# Patient Record
Sex: Female | Born: 1985 | Race: Black or African American | Hispanic: No | Marital: Single | Smoking: Never smoker
Health system: Southern US, Community
[De-identification: ages and names within clinical notes are randomized; demographics above are authoritative.]

---

## 2007-10-18 ENCOUNTER — Emergency Department: Payer: Self-pay | Admitting: Emergency Medicine

## 2008-04-30 ENCOUNTER — Inpatient Hospital Stay: Payer: Self-pay

## 2009-08-22 ENCOUNTER — Emergency Department: Payer: Self-pay | Admitting: Emergency Medicine

## 2011-05-29 IMAGING — CT CT HEAD WITHOUT CONTRAST
2 series · 16 of 30 positions shown, 20 images · non-contrast
Comparison: none

REASON FOR EXAM: 24 hr hx of severe headache, not prone to migraines
COMMENTS:

[Series 2: without · axial · non-contrast · 0.38mm/px · z∈[+450,+570]mm · 13 of 29 slices shown, 17 images]
[im 3/29  brain]
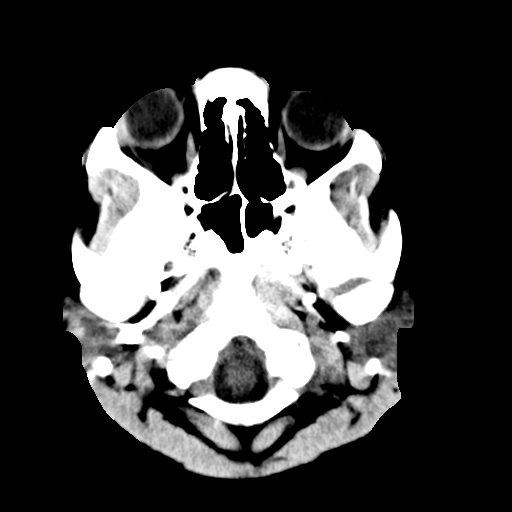
[im 3/29  bone]
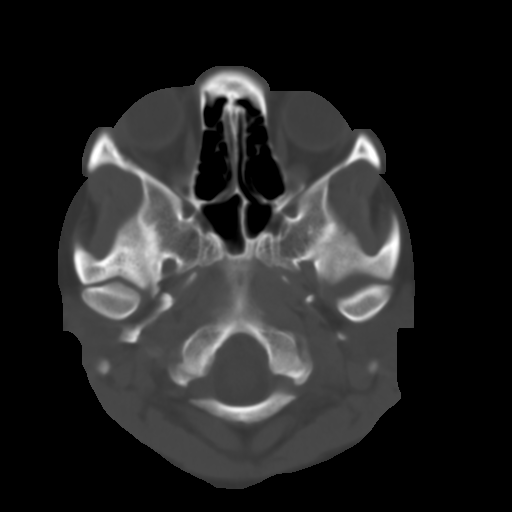
[im 5/29  brain]
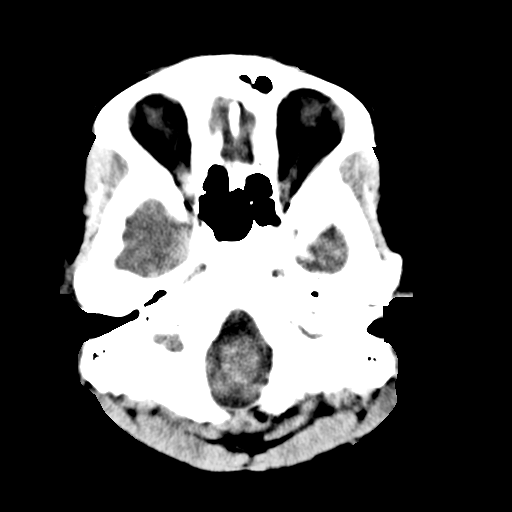
[im 7/29  brain]
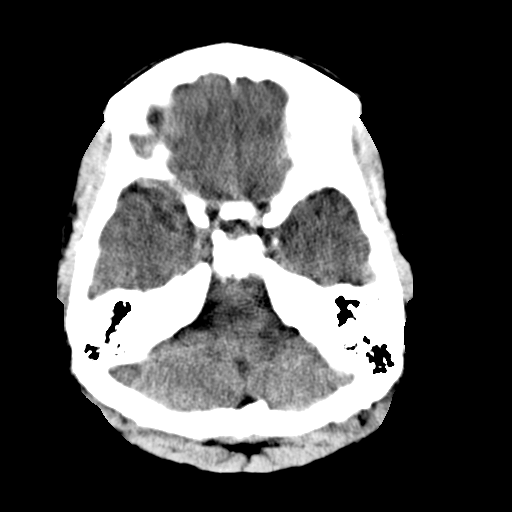
[im 9/29  brain]
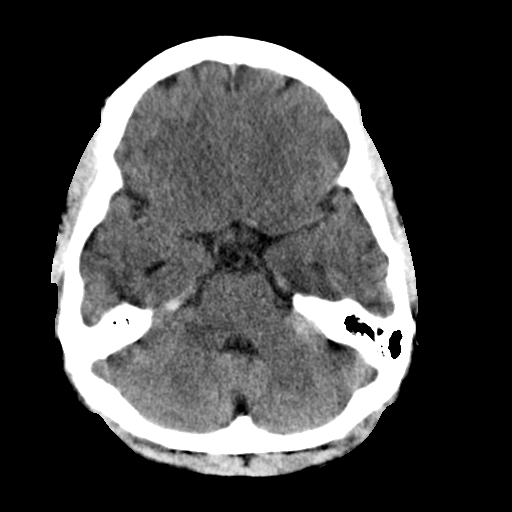
[im 11/29  brain]
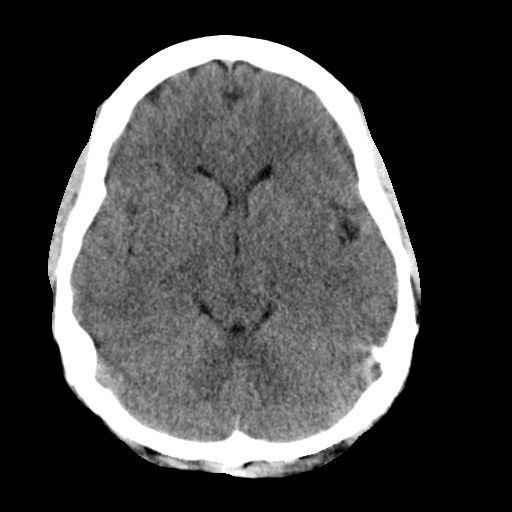
[im 11/29  bone]
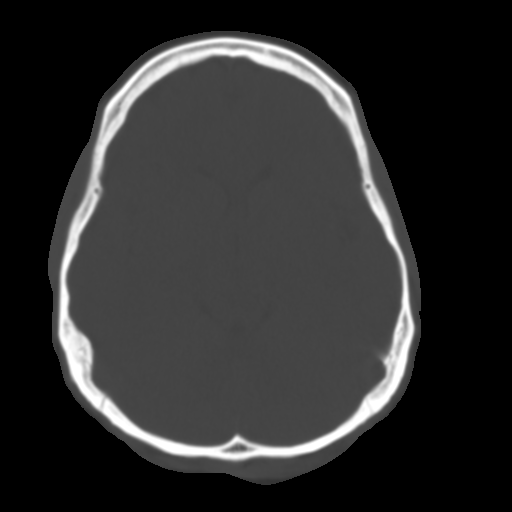
[im 13/29  brain]
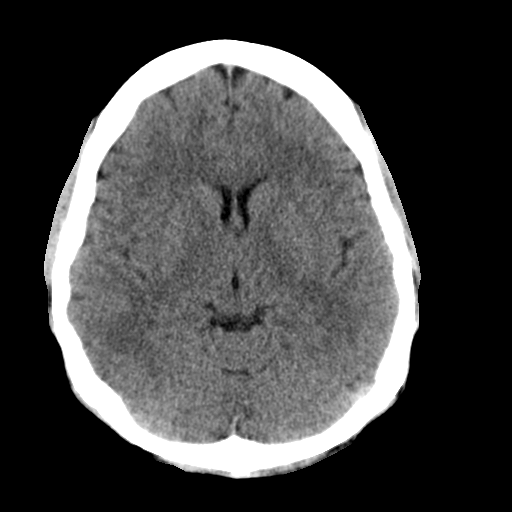
[im 15/29  brain]
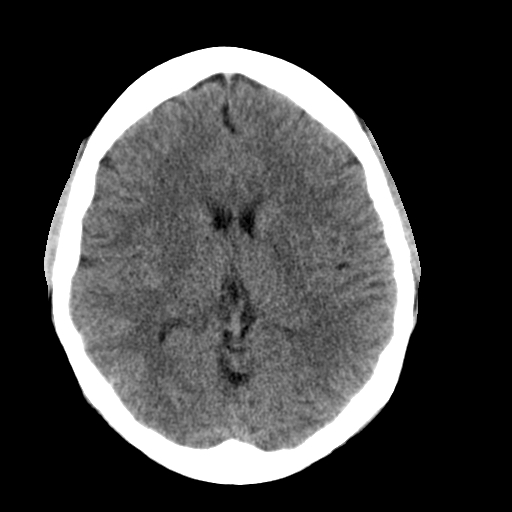
[im 17/29  brain]
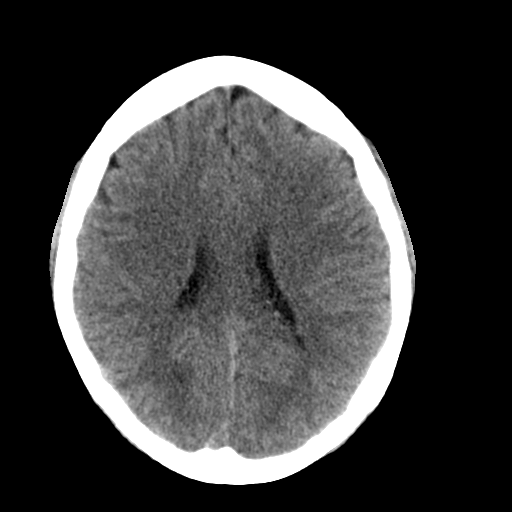
[im 19/29  brain]
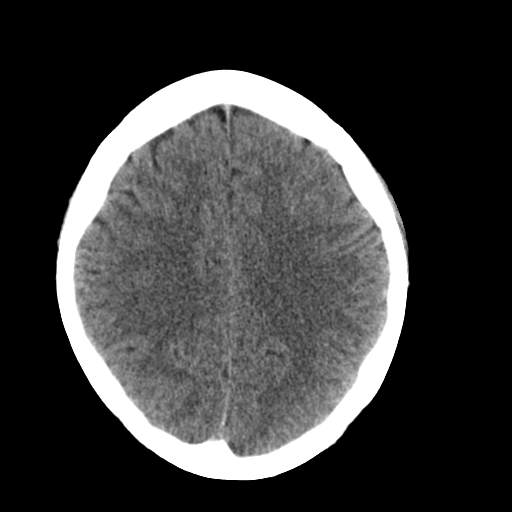
[im 19/29  bone]
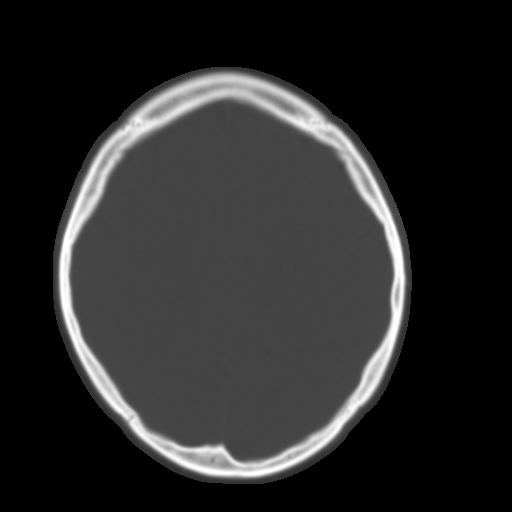
[im 21/29  brain]
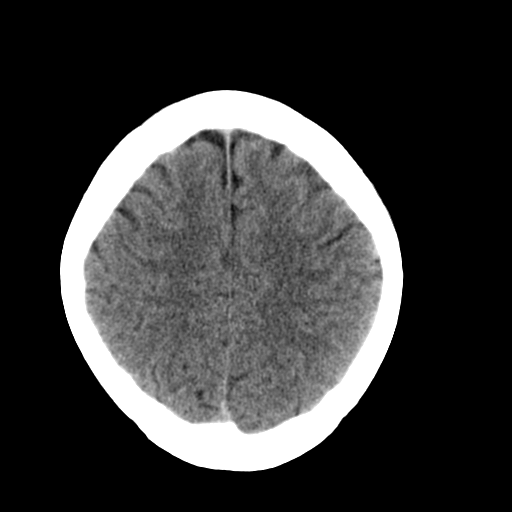
[im 23/29  brain]
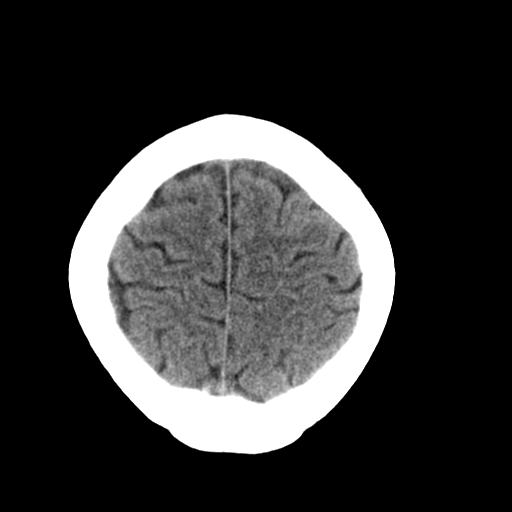
[im 25/29  brain]
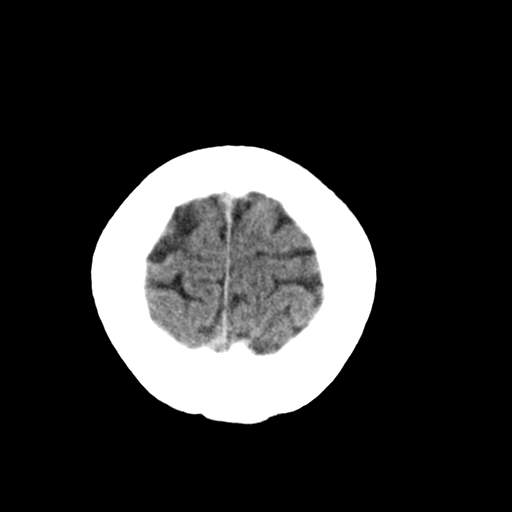
[im 27/29  brain]
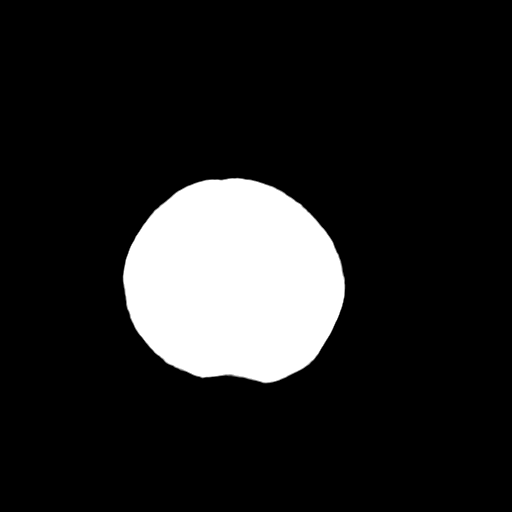
[im 27/29  bone]
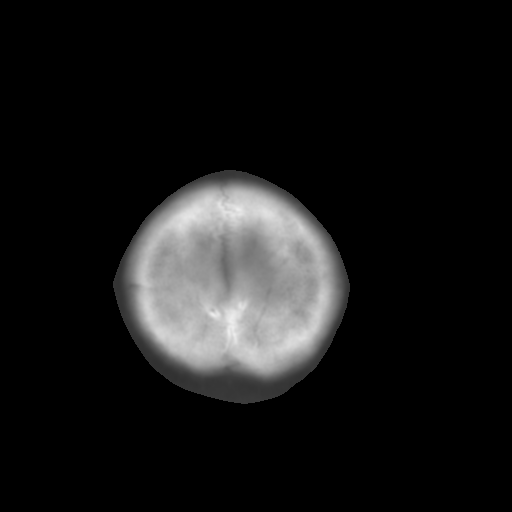

[Series 3: bone · axial · 0.38mm/px · z∈[+450,+490]mm · 3 of 29 slices shown]
[im 3/29  bone]
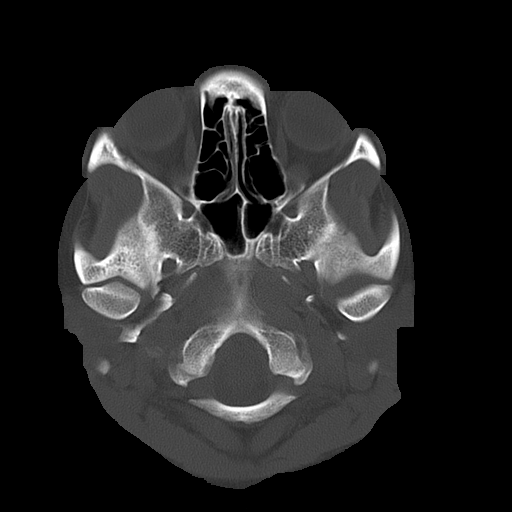
[im 7/29  bone]
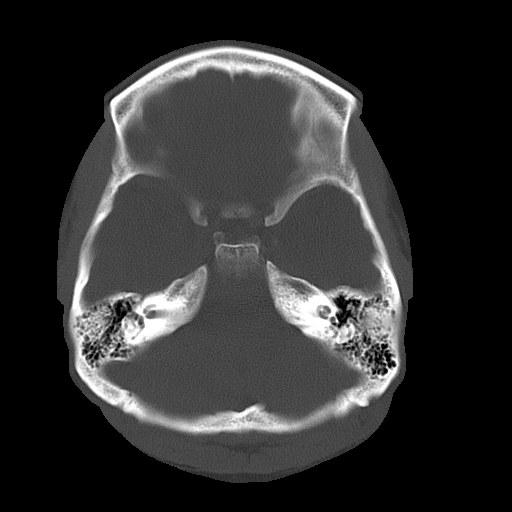
[im 11/29  bone]
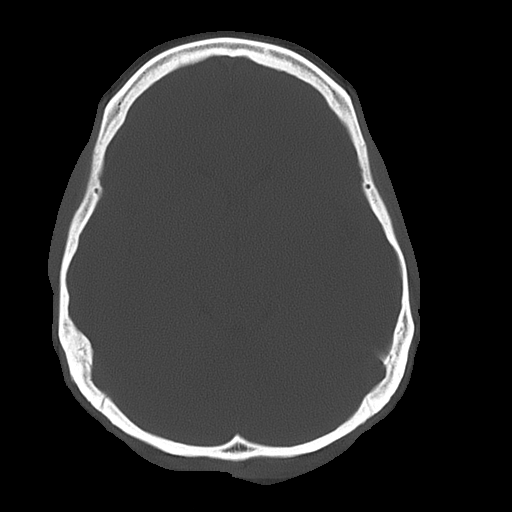

[16 of 30 positions shown; findings below may reference images not displayed]

PROCEDURE:     CT  - CT HEAD WITHOUT CONTRAST  - August 23, 2009 [DATE]

RESULT:     Noncontrast emergent CT of the brain is performed in the
standard fashion. There is no previous exam for comparison.

The ventricles and sulci are normal. There is no hemorrhage. There is no
focal mass, mass-effect or midline shift. There is no evidence of edema or
territorial infarct. The bone windows demonstrate normal aeration of the
paranasal sinuses and mastoid air cells. There is no skull fracture
demonstrated.
IMPRESSION: 1. No acute intracranial abnormality.

## 2011-06-03 ENCOUNTER — Ambulatory Visit: Payer: Self-pay | Admitting: Otolaryngology

## 2013-10-16 ENCOUNTER — Emergency Department: Payer: Self-pay | Admitting: Emergency Medicine

## 2013-10-21 ENCOUNTER — Inpatient Hospital Stay: Payer: Self-pay

## 2013-10-21 LAB — CBC WITH DIFFERENTIAL/PLATELET
Basophil #: 0 10*3/uL (ref 0.0–0.1)
Basophil %: 0.1 %
EOS ABS: 0.1 10*3/uL (ref 0.0–0.7)
Eosinophil %: 1.5 %
HCT: 35.9 % (ref 35.0–47.0)
HGB: 12.3 g/dL (ref 12.0–16.0)
LYMPHS PCT: 22.7 %
Lymphocyte #: 1.8 10*3/uL (ref 1.0–3.6)
MCH: 30.8 pg (ref 26.0–34.0)
MCHC: 34.2 g/dL (ref 32.0–36.0)
MCV: 90 fL (ref 80–100)
MONOS PCT: 7.1 %
Monocyte #: 0.6 x10 3/mm (ref 0.2–0.9)
NEUTROS ABS: 5.5 10*3/uL (ref 1.4–6.5)
NEUTROS PCT: 68.6 %
Platelet: 176 10*3/uL (ref 150–440)
RBC: 3.99 10*6/uL (ref 3.80–5.20)
RDW: 14.6 % — ABNORMAL HIGH (ref 11.5–14.5)
WBC: 8 10*3/uL (ref 3.6–11.0)

## 2013-10-22 LAB — PROTEIN / CREATININE RATIO, URINE
Creatinine, Urine: 117.7 mg/dL (ref 30.0–125.0)
PROTEIN, RANDOM URINE: 26 mg/dL — AB (ref 0–12)
Protein/Creat. Ratio: 221 mg/gCREAT — ABNORMAL HIGH (ref 0–200)

## 2013-10-22 LAB — BASIC METABOLIC PANEL
Anion Gap: 9 (ref 7–16)
BUN: 8 mg/dL (ref 7–18)
CO2: 21 mmol/L (ref 21–32)
CREATININE: 0.61 mg/dL (ref 0.60–1.30)
Calcium, Total: 8.9 mg/dL (ref 8.5–10.1)
Chloride: 106 mmol/L (ref 98–107)
EGFR (African American): >60
EGFR (Non-African Amer.): >60
GLUCOSE: 67 mg/dL (ref 65–99)
OSMOLALITY: 269 (ref 275–301)
POTASSIUM: 3.9 mmol/L (ref 3.5–5.1)
Sodium: 136 mmol/L (ref 136–145)

## 2013-10-22 LAB — URIC ACID: Uric Acid: 5.2 mg/dL (ref 2.6–6.0)

## 2013-10-22 LAB — SGOT (AST)(ARMC): AST: 28 U/L (ref 15–37)

## 2013-10-23 LAB — HEMATOCRIT: HCT: 34.9 % — ABNORMAL LOW (ref 35.0–47.0)

## 2013-10-24 LAB — PATHOLOGY REPORT

## 2014-08-06 NOTE — H&P (Signed)
L&D Evaluation:  History:  HPI Pt is a 29 yo G2P1001 at 36.[redacted] weeks GA with an EDC of 11/17/13 presents to L&D after PPROM at home. She reports +FM, and ctx. She denies vb. Her prenatal course is significant for fetal renal pyelectasis which was resolved by 28 weeks. She is A+, RI, VI, and GBS negative. She recieved her Tdap at 30 weeks.   Presents with contractions, leaking fluid   Patient's Medical History other  allergic rhinitis   Patient's Surgical History none   Medications Pre Natal Vitamins  zyrtec, flonase   Allergies NKDA   Social History none   Family History Non-Contributory   ROS:  ROS All systems were reviewed.  HEENT, CNS, GI, GU, Respiratory, CV, Renal and Musculoskeletal systems were found to be normal.   Exam:  Vital Signs stable   General no apparent distress   Mental Status clear   Chest clear   Heart normal sinus rhythm   Abdomen gravid, tender with contractions   Pelvic no external lesions, 3cm upon arrival   Mebranes Ruptured   Description clear   FHT normal rate with no decels   Ucx regular   Skin dry, no lesions, no rashes   Lymph no lymphadenopathy   Impression:  Impression PPROM, reactive NST, IUP at 36.1 weeks, Labor   Plan:  Plan monitor contractions and for cervical change, anticipate svd   Follow Up Appointment need to schedule   Electronic Signatures: Jannet MantisSubudhi, Yomara Toothman (CNM)  (Signed 27-Jul-15 06:29)  Authored: L&D Evaluation   Last Updated: 27-Jul-15 06:29 by Jannet MantisSubudhi, Ruthetta Koopmann (CNM)

## 2015-03-30 NOTE — L&D Delivery Note (Addendum)
Deliver Note   Date of Delivery:   11/13/2015 Primary OB:   [facility] Gestational Age/EDD: 3019w3d by 11/17/2015, by Last Menstrual Period  Antepartum complications:  OB History    Gravida Para Term Preterm AB Living   3 2 1 1   2    SAB TAB Ectopic Multiple Live Births                  Delivered By:   Vena AustriaStaebler, Lerline Valdivia MD  Delivery Type:    Anesthesia:       Intrapartum complications:  GBS:    Negative (07/27 0000) Laceration:     none Episiotomy:    none Placenta:    Spontaneous Estimated Blood Loss:    Baby:    Liveborn female , APGAR (1 MIN):  8 APGAR (5 MINS):  9 weight   7lsb 8oz   Deliver Details   A liveborn female was delivered via TSVD (Presentation:ROA ).  APGAR: 8, 9 ; weight  7lbs 8oz.   Placenta status: spontaneous, intact.  Cord: 3VC without complications: Marland Kitchen.    Mom to postpartum.  Baby to Couplet care / Skin to Skin.

## 2015-04-30 LAB — OB RESULTS CONSOLE RUBELLA ANTIBODY, IGM: RUBELLA: IMMUNE

## 2015-04-30 LAB — OB RESULTS CONSOLE VARICELLA ZOSTER ANTIBODY, IGG: Varicella: IMMUNE

## 2015-04-30 LAB — OB RESULTS CONSOLE HIV ANTIBODY (ROUTINE TESTING): HIV: NONREACTIVE

## 2015-04-30 LAB — OB RESULTS CONSOLE HEPATITIS B SURFACE ANTIGEN: Hepatitis B Surface Ag: NEGATIVE

## 2015-04-30 LAB — OB RESULTS CONSOLE RPR: RPR: NONREACTIVE

## 2015-10-23 LAB — OB RESULTS CONSOLE GBS: STREP GROUP B AG: NEGATIVE

## 2015-11-13 ENCOUNTER — Inpatient Hospital Stay
Admission: EM | Admit: 2015-11-13 | Discharge: 2015-11-15 | DRG: 775 | Disposition: A | Discharge status: Home / Self Care | Payer: BLUE CROSS/BLUE SHIELD | Attending: Obstetrics and Gynecology | Admitting: Obstetrics and Gynecology

## 2015-11-13 ENCOUNTER — Encounter: Payer: Self-pay | Admitting: *Deleted

## 2015-11-13 ENCOUNTER — Inpatient Hospital Stay: Payer: BLUE CROSS/BLUE SHIELD | Admitting: Anesthesiology

## 2015-11-13 DIAGNOSIS — O4202 Full-term premature rupture of membranes, onset of labor within 24 hours of rupture: Secondary | ICD-10-CM | POA: Diagnosis present

## 2015-11-13 DIAGNOSIS — Z3A39 39 weeks gestation of pregnancy: Secondary | ICD-10-CM | POA: Diagnosis not present

## 2015-11-13 LAB — CBC
HEMATOCRIT: 35.8 % (ref 35.0–47.0)
Hemoglobin: 12.6 g/dL (ref 12.0–16.0)
MCH: 31.1 pg (ref 26.0–34.0)
MCHC: 35.1 g/dL (ref 32.0–36.0)
MCV: 88.7 fL (ref 80.0–100.0)
Platelets: 131 10*3/uL — ABNORMAL LOW (ref 150–440)
RBC: 4.04 MIL/uL (ref 3.80–5.20)
RDW: 13.9 % (ref 11.5–14.5)
WBC: 6.9 10*3/uL (ref 3.6–11.0)

## 2015-11-13 LAB — TYPE AND SCREEN
ABO/RH(D): A POS
ANTIBODY SCREEN: NEGATIVE

## 2015-11-13 MED ORDER — LIDOCAINE HCL (PF) 1 % IJ SOLN
INTRAMUSCULAR | Status: DC | PRN
  Administered 2015-11-13: 3 mL via SUBCUTANEOUS

## 2015-11-13 MED ORDER — SODIUM CHLORIDE FLUSH 0.9 % IV SOLN
INTRAVENOUS | Status: AC
  Filled 2015-11-13: qty 10

## 2015-11-13 MED ORDER — ONDANSETRON HCL 4 MG/2ML IJ SOLN: 4.0000 mg | Freq: Three times a day (TID) | INTRAMUSCULAR | Status: DC | PRN

## 2015-11-13 MED ORDER — NALBUPHINE HCL 10 MG/ML IJ SOLN: 5.0000 mg | INTRAMUSCULAR | Status: DC | PRN

## 2015-11-13 MED ORDER — LIDOCAINE-EPINEPHRINE (PF) 1.5 %-1:200000 IJ SOLN
INTRAMUSCULAR | Status: DC | PRN
  Administered 2015-11-13: 3 mL via EPIDURAL

## 2015-11-13 MED ORDER — SOD CITRATE-CITRIC ACID 500-334 MG/5ML PO SOLN: 30.0000 mL | ORAL | Status: DC | PRN

## 2015-11-13 MED ORDER — ACETAMINOPHEN 325 MG PO TABS: 650.0000 mg | ORAL_TABLET | ORAL | Status: DC | PRN

## 2015-11-13 MED ORDER — MISOPROSTOL 200 MCG PO TABS
ORAL_TABLET | ORAL | Status: AC
  Filled 2015-11-13: qty 4

## 2015-11-13 MED ORDER — OXYCODONE-ACETAMINOPHEN 5-325 MG PO TABS: 1.0000 | ORAL_TABLET | ORAL | Status: DC | PRN

## 2015-11-13 MED ORDER — LIDOCAINE HCL (PF) 1 % IJ SOLN
INTRAMUSCULAR | Status: AC
  Filled 2015-11-13: qty 30

## 2015-11-13 MED ORDER — NALBUPHINE HCL 10 MG/ML IJ SOLN: 5.0000 mg | Freq: Once | INTRAMUSCULAR | Status: DC | PRN

## 2015-11-13 MED ORDER — SODIUM CHLORIDE 0.9% FLUSH: 3.0000 mL | INTRAVENOUS | Status: DC | PRN

## 2015-11-13 MED ORDER — MEPERIDINE HCL 25 MG/ML IJ SOLN: 6.2500 mg | INTRAMUSCULAR | Status: DC | PRN

## 2015-11-13 MED ORDER — DEXTROSE 5 % IV SOLN
1.0000 ug/kg/h | INTRAVENOUS | Status: DC | PRN
  Filled 2015-11-13: qty 2

## 2015-11-13 MED ORDER — OXYTOCIN 40 UNITS IN LACTATED RINGERS INFUSION - SIMPLE MED
1.0000 m[IU]/min | INTRAVENOUS | Status: DC
  Administered 2015-11-13: 2 m[IU]/min via INTRAVENOUS

## 2015-11-13 MED ORDER — FENTANYL 2.5 MCG/ML W/ROPIVACAINE 0.2% IN NS 100 ML EPIDURAL INFUSION (ARMC-ANES)
EPIDURAL | Status: DC | PRN
  Administered 2015-11-13: 9 mL/h via EPIDURAL

## 2015-11-13 MED ORDER — DIPHENHYDRAMINE HCL 50 MG/ML IJ SOLN: 12.5000 mg | INTRAMUSCULAR | Status: DC | PRN

## 2015-11-13 MED ORDER — BUPIVACAINE HCL (PF) 0.25 % IJ SOLN
INTRAMUSCULAR | Status: DC | PRN
Start: 2015-11-13 — End: 2015-11-13
  Administered 2015-11-13: 3 mL via EPIDURAL
  Administered 2015-11-13: 5 mL via EPIDURAL

## 2015-11-13 MED ORDER — DIBUCAINE 1 % RE OINT: 1.0000 "application " | TOPICAL_OINTMENT | RECTAL | Status: DC | PRN

## 2015-11-13 MED ORDER — LACTATED RINGERS IV SOLN: 500.0000 mL | INTRAVENOUS | Status: DC | PRN

## 2015-11-13 MED ORDER — COCONUT OIL OIL: 1.0000 "application " | TOPICAL_OIL | Status: DC | PRN

## 2015-11-13 MED ORDER — BENZOCAINE-MENTHOL 20-0.5 % EX AERO: 1.0000 "application " | INHALATION_SPRAY | CUTANEOUS | Status: DC | PRN

## 2015-11-13 MED ORDER — DIPHENHYDRAMINE HCL 25 MG PO CAPS: 25.0000 mg | ORAL_CAPSULE | ORAL | Status: DC | PRN

## 2015-11-13 MED ORDER — ONDANSETRON HCL 4 MG/2ML IJ SOLN
4.0000 mg | Freq: Once | INTRAMUSCULAR | Status: DC
  Filled 2015-11-13: qty 2

## 2015-11-13 MED ORDER — OXYTOCIN 40 UNITS IN LACTATED RINGERS INFUSION - SIMPLE MED
2.5000 [IU]/h | INTRAVENOUS | Status: DC
  Filled 2015-11-13 (×2): qty 1000

## 2015-11-13 MED ORDER — ONDANSETRON HCL 4 MG PO TABS: 4.0000 mg | ORAL_TABLET | ORAL | Status: DC | PRN

## 2015-11-13 MED ORDER — SCOPOLAMINE 1 MG/3DAYS TD PT72: 1.0000 | MEDICATED_PATCH | Freq: Once | TRANSDERMAL | Status: DC

## 2015-11-13 MED ORDER — FENTANYL 2.5 MCG/ML W/ROPIVACAINE 0.2% IN NS 100 ML EPIDURAL INFUSION (ARMC-ANES)
EPIDURAL | Status: AC
  Filled 2015-11-13: qty 100

## 2015-11-13 MED ORDER — LACTATED RINGERS IV SOLN: INTRAVENOUS | Status: DC

## 2015-11-13 MED ORDER — BUTORPHANOL TARTRATE 1 MG/ML IJ SOLN
1.0000 mg | INTRAMUSCULAR | Status: DC | PRN
  Administered 2015-11-13: 1 mg via INTRAVENOUS
  Filled 2015-11-13: qty 1

## 2015-11-13 MED ORDER — LACTATED RINGERS IV SOLN
INTRAVENOUS | Status: DC
  Administered 2015-11-13: 1000 mL via INTRAVENOUS

## 2015-11-13 MED ORDER — DIPHENHYDRAMINE HCL 25 MG PO CAPS: 25.0000 mg | ORAL_CAPSULE | Freq: Four times a day (QID) | ORAL | Status: DC | PRN

## 2015-11-13 MED ORDER — ONDANSETRON HCL 4 MG/2ML IJ SOLN
4.0000 mg | Freq: Four times a day (QID) | INTRAMUSCULAR | Status: DC | PRN
  Administered 2015-11-13: 4 mg via INTRAVENOUS
  Filled 2015-11-13: qty 2

## 2015-11-13 MED ORDER — NALOXONE HCL 0.4 MG/ML IJ SOLN: 0.4000 mg | INTRAMUSCULAR | Status: DC | PRN

## 2015-11-13 MED ORDER — WITCH HAZEL-GLYCERIN EX PADS: 1.0000 "application " | MEDICATED_PAD | CUTANEOUS | Status: DC | PRN

## 2015-11-13 MED ORDER — SENNOSIDES-DOCUSATE SODIUM 8.6-50 MG PO TABS
2.0000 | ORAL_TABLET | ORAL | Status: DC
  Administered 2015-11-14 – 2015-11-15 (×2): 2 via ORAL
  Filled 2015-11-13 (×2): qty 2

## 2015-11-13 MED ORDER — FENTANYL 2.5 MCG/ML W/ROPIVACAINE 0.2% IN NS 100 ML EPIDURAL INFUSION (ARMC-ANES): 10.0000 mL/h | EPIDURAL | Status: DC

## 2015-11-13 MED ORDER — OXYTOCIN BOLUS FROM INFUSION: 500.0000 mL | Freq: Once | INTRAVENOUS | Status: DC

## 2015-11-13 MED ORDER — OXYTOCIN 10 UNIT/ML IJ SOLN
INTRAMUSCULAR | Status: AC
  Filled 2015-11-13: qty 2

## 2015-11-13 MED ORDER — PRENATAL MULTIVITAMIN CH
1.0000 | ORAL_TABLET | Freq: Every day | ORAL | Status: DC
  Administered 2015-11-14: 1 via ORAL
  Filled 2015-11-13: qty 1

## 2015-11-13 MED ORDER — IBUPROFEN 600 MG PO TABS
600.0000 mg | ORAL_TABLET | Freq: Four times a day (QID) | ORAL | Status: DC
  Administered 2015-11-14 – 2015-11-15 (×6): 600 mg via ORAL
  Filled 2015-11-13 (×6): qty 1

## 2015-11-13 MED ORDER — OXYCODONE-ACETAMINOPHEN 5-325 MG PO TABS: 2.0000 | ORAL_TABLET | ORAL | Status: DC | PRN

## 2015-11-13 MED ORDER — TERBUTALINE SULFATE 1 MG/ML IJ SOLN: 0.2500 mg | Freq: Once | INTRAMUSCULAR | Status: DC | PRN

## 2015-11-13 MED ORDER — ACETAMINOPHEN 325 MG PO TABS: 650.0000 mg | ORAL_TABLET | Freq: Four times a day (QID) | ORAL | Status: DC

## 2015-11-13 MED ORDER — KETOROLAC TROMETHAMINE 30 MG/ML IJ SOLN: 30.0000 mg | Freq: Four times a day (QID) | INTRAMUSCULAR | Status: DC | PRN

## 2015-11-13 MED ORDER — KETOROLAC TROMETHAMINE 30 MG/ML IJ SOLN
30.0000 mg | Freq: Four times a day (QID) | INTRAMUSCULAR | Status: DC | PRN
Start: 2015-11-13 — End: 2015-11-13

## 2015-11-13 MED ORDER — LIDOCAINE HCL (PF) 1 % IJ SOLN: 30.0000 mL | INTRAMUSCULAR | Status: DC | PRN

## 2015-11-13 MED ORDER — SIMETHICONE 80 MG PO CHEW: 80.0000 mg | CHEWABLE_TABLET | ORAL | Status: DC | PRN

## 2015-11-13 MED ORDER — AMMONIA AROMATIC IN INHA
RESPIRATORY_TRACT | Status: AC
  Filled 2015-11-13: qty 10

## 2015-11-13 MED ORDER — ONDANSETRON HCL 4 MG/2ML IJ SOLN: 4.0000 mg | INTRAMUSCULAR | Status: DC | PRN

## 2015-11-13 NOTE — Anesthesia Preprocedure Evaluation (Signed)
Anesthesia Evaluation  Patient identified by MRN, date of birth, ID band Patient awake    Reviewed: Allergy & Precautions, NPO status , Patient's Chart, lab work & pertinent test results  History of Anesthesia Complications Negative for: history of anesthetic complications  Airway Mallampati: II  TM Distance: >3 FB Neck ROM: Full    Dental no notable dental hx.    Pulmonary neg pulmonary ROS,    breath sounds clear to auscultation- rhonchi (-) wheezing      Cardiovascular Exercise Tolerance: Good (-) hypertension(-) CAD and (-) Past MI  Rhythm:Regular Rate:Normal - Systolic murmurs and - Diastolic murmurs    Neuro/Psych negative neurological ROS  negative psych ROS   GI/Hepatic negative GI ROS, Neg liver ROS,   Endo/Other  negative endocrine ROSneg diabetes  Renal/GU negative Renal ROS     Musculoskeletal   Abdominal (+) + obese, Gravid abdomen   Peds  Hematology negative hematology ROS (+)   Anesthesia Other Findings   Reproductive/Obstetrics (+) Pregnancy                             Anesthesia Physical Anesthesia Plan  ASA: II  Anesthesia Plan: Epidural   Post-op Pain Management:    Induction:   Airway Management Planned:   Additional Equipment:   Intra-op Plan:   Post-operative Plan:   Informed Consent: I have reviewed the patients History and Physical, chart, labs and discussed the procedure including the risks, benefits and alternatives for the proposed anesthesia with the patient or authorized representative who has indicated his/her understanding and acceptance.     Plan Discussed with: CRNA and Anesthesiologist  Anesthesia Plan Comments: (Plan for epidural for labor. Discussed epidural vs spinal vs GA if need for C-section)        Anesthesia Quick Evaluation

## 2015-11-13 NOTE — OB Triage Note (Signed)
Here with complaint of SROM at 0700.

## 2015-11-13 NOTE — Progress Notes (Signed)
Subjective:  Comfortable with epidural in place  Objective:   Vitals: Blood pressure 127/62, pulse 87, temperature 98 F (36.7 C), temperature source Oral, resp. rate 17, height 5\' 7"  (1.702 m), weight 93.9 kg (207 lb), last menstrual period 02/10/2015, SpO2 99 %. General: NAD Abdomen: gravid, non-tender Cervical Exam:  Dilation: 5.5 Effacement (%): 80 Cervical Position: Posterior Station: -1 Presentation: Vertex Exam by:: Falen Lehrmann  FHT: 130, moderate, +accels, no decels Toco: spaced out since epidural  Results for orders placed or performed during the hospital encounter of 11/13/15 (from the past 24 hour(s))  CBC     Status: Abnormal   Collection Time: 11/13/15  9:08 AM  Result Value Ref Range   WBC 6.9 3.6 - 11.0 K/uL   RBC 4.04 3.80 - 5.20 MIL/uL   Hemoglobin 12.6 12.0 - 16.0 g/dL   HCT 16.135.8 09.635.0 - 04.547.0 %   MCV 88.7 80.0 - 100.0 fL   MCH 31.1 26.0 - 34.0 pg   MCHC 35.1 32.0 - 36.0 g/dL   RDW 40.913.9 81.111.5 - 91.414.5 %   Platelets 131 (L) 150 - 440 K/uL  Type and screen [facility]     Status: None   Collection Time: 11/13/15  9:08 AM  Result Value Ref Range   ABO/RH(D) A POS    Antibody Screen NEG    Sample Expiration 11/16/2015     Assessment:   29 y.o. [id number] 3660w3d PROM  Plan:   1) Labor - restart pitocin  2) Fetus -  Cat I tracing

## 2015-11-13 NOTE — Discharge Summary (Signed)
Obstetric Discharge Summary Reason for Admission: onset of labor Prenatal Procedures: NST Intrapartum Procedures: spontaneous vaginal delivery Postpartum Procedures: none Complications-Operative and Postpartum: none Hemoglobin  Date Value Ref Range Status  11/13/2015 12.6 12.0 - 16.0 g/dL Final   HGB  Date Value Ref Range Status  10/21/2013 12.3 12.0 - 16.0 g/dL Final   HCT  Date Value Ref Range Status  11/13/2015 35.8 35.0 - 47.0 % Final  10/23/2013 34.9 (L) 35.0 - 47.0 % Final    Physical Exam:  General: alert, appears stated age and no distress Lochia: appropriate Uterine Fundus: firm Incision: healing well DVT Evaluation: No evidence of DVT seen on physical exam.  Discharge Diagnoses: Term Pregnancy-delivered  Discharge Information: Date: 11/13/2015 Activity: pelvic rest Diet: routine   Medication List    STOP taking these medications   ferrous sulfate 325 (65 FE) MG EC tablet     TAKE these medications   ibuprofen 600 MG tablet Commonly known as:  ADVIL,MOTRIN Take 1 tablet (600 mg total) by mouth every 6 (six) hours as needed for mild pain or moderate pain.   prenatal multivitamin Tabs tablet Take 1 tablet by mouth daily at 12 noon.       Condition: stable Discharge to: home Follow-up Information    Lorrene ReidSTAEBLER, Alexxia Stankiewicz M, MD Follow up in 6 week(s).   Specialty:  Obstetrics and Gynecology Why:  postpartum visit Contact information: [address] [place] [address] [phone]           Newborn Data: Live born unspecified sex  Birth Weight:   APGAR: ,   Home with mother.  Gail Lowe 11/13/2015, 5:56 PM

## 2015-11-13 NOTE — H&P (Signed)
Obstetric H&P   Chief Complaint: Leaking fluid  Prenatal Care Provider: [facility]  History of Present Illness: 30 y.o. [id number] presenting at 39 weeks 2 days [facility]/2015 presenting with loss of fluid this AM.  +FM, no VB, irregular contractions.   Last check in clinic 3cm dilated yesterday  A pos / ABSC neg / RI / VZI / HBsAg neg / HIV neg / RPR NR / 1-hr 97 / GBS negative   Review of Systems: 10 point review of systems negative unless otherwise noted in HPI  Past Medical History: No past medical history on file.  Past Surgical History: No past surgical history on file.  Past Obstetric History: [id number], pelvis tested to 6lbs 6oz  Family History: No family history on file.  Social History: Social History   Social History  . Marital status: Single    Spouse name: N/A  . Number of children: N/A  . Years of education: N/A   Occupational History  . Not on file.   Social History Main Topics  . Smoking status: Not on file  . Smokeless tobacco: Not on file  . Alcohol use Not on file  . Drug use: Unknown  . Sexual activity: Not on file   Other Topics Concern  . Not on file   Social History Narrative  . No narrative on file    Medications: Prior to Admission medications   Not on File    Allergies: Allergies not on file  Physical Exam: There were no vitals filed for this visit.  Urine Dip Protein: N/A  FHT: 140, moderate, +accels, no decels Toco: irregular  General: NAD HEENT: normocephalic, anicteric Pulmonary: no increased work of breathing Cardiovascular: RRR Abdomen: Gravid, non-tender Leopolds: vtx Genitourinary: grossly ruptured Extremities: trace BLE edema  Labs: No results found for this or any previous visit (from the past 24 hour(s)).  Assessment: 30 y.o. [id number] at 4913w2d presenting at term with rupture of membranes  Plan: 1) SROM - monitor for progression, if needs pitocin will augment as needed.  GBS negative   2) Fetus -  cat I tracing -31lbs weight gain - tested to 6lbs 6oz  3) PNL  A pos / ABSC neg / RI / VZI / HBsAg neg / HIV neg / RPR NR / 1-hr 97 / GBS negative  4) TDAP - 09/09/2015  5) Disposition - pending delivery

## 2015-11-13 NOTE — Anesthesia Procedure Notes (Signed)
Epidural Patient location during procedure: OB  Staffing Anesthesiologist: Priscella MannPENWARDEN, AMY Resident/CRNA: Mathews ArgyleLOGAN, Charliene Inoue Performed: resident/CRNA   Preanesthetic Checklist Completed: patient identified, site marked, surgical consent, pre-op evaluation, timeout performed, IV checked, risks and benefits discussed and monitors and equipment checked  Epidural Patient position: sitting Prep: Betadine and site prepped and draped Patient monitoring: heart rate, continuous pulse ox and blood pressure Approach: midline Location: L3-L4 Injection technique: LOR saline  Needle:  Needle type: Tuohy  Needle gauge: 17 G Needle insertion depth: 5 cm Catheter type: closed end flexible Catheter size: 19 Gauge Catheter at skin depth: 9 cm Test dose: negative and 1.5% lidocaine with Epi 1:200 K  Assessment Events: blood not aspirated, injection not painful, no injection resistance, negative IV test and no paresthesia  Additional Notes   Patient tolerated the insertion well without complications.Reason for block:procedure for pain

## 2015-11-14 ENCOUNTER — Encounter: Payer: Self-pay | Admitting: Certified Registered Nurse Anesthetist

## 2015-11-14 ENCOUNTER — Encounter
Admission: EM | Disposition: A | Discharge status: Home / Self Care | Payer: Self-pay | Source: Home / Self Care | Attending: Obstetrics and Gynecology

## 2015-11-14 LAB — CBC
HEMATOCRIT: 37.2 % (ref 35.0–47.0)
Hemoglobin: 12.8 g/dL (ref 12.0–16.0)
MCH: 31 pg (ref 26.0–34.0)
MCHC: 34.3 g/dL (ref 32.0–36.0)
MCV: 90.5 fL (ref 80.0–100.0)
PLATELETS: 141 10*3/uL — AB (ref 150–440)
RBC: 4.12 MIL/uL (ref 3.80–5.20)
RDW: 13.9 % (ref 11.5–14.5)
WBC: 12.9 10*3/uL — ABNORMAL HIGH (ref 3.6–11.0)

## 2015-11-14 LAB — RPR: RPR Ser Ql: NONREACTIVE

## 2015-11-14 SURGERY — LIGATION, FALLOPIAN TUBE, POSTPARTUM
Anesthesia: Choice

## 2015-11-14 NOTE — Anesthesia Postprocedure Evaluation (Signed)
Anesthesia Post Note  Patient: Gail Lowe  Procedure(s) Performed: CLE  Patient location during evaluation: Mother Baby Anesthesia Type: Epidural Level of consciousness: awake and alert Pain management: pain level controlled Respiratory status: spontaneous breathing Cardiovascular status: blood pressure returned to baseline Postop Assessment: no headache and no backache Anesthetic complications: no    Last Vitals:  Vitals:   11/14/15 0109 11/14/15 0450  BP: 115/70 120/60  Pulse: 73 65  Resp: 18 20  Temp: 36.8 C 36.9 C    Last Pain:  Vitals:   11/14/15 0450  TempSrc: Oral  PainSc:                  Jules SchickLogan,  Saleen Peden P

## 2015-11-14 NOTE — Progress Notes (Signed)
Patient ID: Chad CordialMagan Adelle Huish, female   DOB: 1985/09/01, 30 y.o.   MRN: [id number] Obstetric Postpartum Daily Progress Note Subjective:  30 y.o. [id number] postpartum day #1 status post vaginal delivery.  She is ambulating, is tolerating po, is voiding spontaneously.  Her pain is well controlled on PO pain medications. Her lochia is less than menses.   Medications SCHEDULED MEDICATIONS  . ibuprofen  600 mg Oral Q6H  . ondansetron (ZOFRAN) IV  4 mg Intravenous Once  . prenatal multivitamin  1 tablet Oral Q1200  . senna-docusate  2 tablet Oral Q24H    MEDICATION INFUSIONS  . lactated ringers      PRN MEDICATIONS  acetaminophen, benzocaine-Menthol, coconut oil, witch hazel-glycerin **AND** dibucaine, diphenhydrAMINE, ondansetron **OR** ondansetron (ZOFRAN) IV, oxyCODONE-acetaminophen, oxyCODONE-acetaminophen, simethicone    Objective:   Vitals:   11/13/15 2035 11/14/15 0109 11/14/15 0450 11/14/15 0740  BP: 133/74 115/70 120/60 (!) 132/54  Pulse: 89 73 65 84  Resp: 20 18 20 20   Temp: 98.6 F (37 C) 98.2 F (36.8 C) 98.4 F (36.9 C) 98.4 F (36.9 C)  TempSrc: Oral Oral Oral Oral  SpO2:    100%  Weight:      Height:        Current Vital Signs 24h Vital Sign Ranges  T 98.4 F (36.9 C) Temp  Avg: 98.1 F (36.7 C)  Min: 97.1 F (36.2 C)  Max: 98.7 F (37.1 C)  BP (!) 132/54 BP  Min: 115/70  Max: 144/83  HR 84 Pulse  Avg: 82.5  Min: 65  Max: 103  RR 20 Resp  Avg: 18.5  Min: 17  Max: 20  SaO2 100 % Not Delivered SpO2  Avg: 99.8 %  Min: 99 %  Max: 100 %       24 Hour I/O Current Shift I/O  Time Ins Outs 08/17 0701 - 08/18 0700 In: 1642 [I.V.:1642] Out: 2275 [Urine:1375] No intake/output data recorded.  General: NAD Pulmonary: no increased work of breathing Abdomen: non-distended, non-tender, fundus firm at level of umbilicus Extremities: no edema, no erythema, no tenderness  Labs:   Recent Labs Lab 11/13/15 0908 11/14/15 0448  WBC 6.9 12.9*  HGB 12.6 12.8  HCT  35.8 37.2  PLT 131* 141*     Assessment:   30 y.o. [id number] postpartum day # 1 status post SVD, doing well.  She has changed her mind and no longer wants a pp BTL  Plan:   1) A POS / Rubella Immune (02/01 0000)/ Varicella Immune  2) TDAP status up to date  4) Formula feeding /Contraception = Now desires Nexplanon.  No longer wants BTL.  Discussed risks, benefits, alternatives. She is to call clinic to come in and sign order form early after discharge so that implant will be avialable at 6 week follow up  5) Disposition: home tomorrow.  Thomasene MohairStephen Aiken Withem, MD 11/14/2015 9:26 AM

## 2015-11-15 MED ORDER — IBUPROFEN 600 MG PO TABS: 600.0000 mg | ORAL_TABLET | Freq: Four times a day (QID) | ORAL | 0 refills | Status: AC | PRN

## 2015-11-15 NOTE — Progress Notes (Signed)
Pt discharged home with infant.  Discharge instructions and follow up appointment given to and reviewed with pt.  Pt verbalized understanding.  Escorted by staff. 

## 2019-02-24 ENCOUNTER — Encounter: Payer: Self-pay | Admitting: Emergency Medicine

## 2019-02-24 ENCOUNTER — Other Ambulatory Visit: Payer: Self-pay

## 2019-02-24 ENCOUNTER — Emergency Department
Admission: EM | Admit: 2019-02-24 | Discharge: 2019-02-25 | Disposition: A | Discharge status: Home / Self Care | Payer: 59 | Attending: Emergency Medicine | Admitting: Emergency Medicine

## 2019-02-24 DIAGNOSIS — R739 Hyperglycemia, unspecified: Secondary | ICD-10-CM

## 2019-02-24 DIAGNOSIS — Z79899 Other long term (current) drug therapy: Secondary | ICD-10-CM | POA: Insufficient documentation

## 2019-02-24 DIAGNOSIS — R002 Palpitations: Secondary | ICD-10-CM | POA: Diagnosis not present

## 2019-02-24 DIAGNOSIS — E876 Hypokalemia: Secondary | ICD-10-CM

## 2019-02-24 DIAGNOSIS — R55 Syncope and collapse: Secondary | ICD-10-CM | POA: Diagnosis present

## 2019-02-24 DIAGNOSIS — F12929 Cannabis use, unspecified with intoxication, unspecified: Secondary | ICD-10-CM

## 2019-02-24 LAB — CBC
HCT: 38.3 % (ref 36.0–46.0)
Hemoglobin: 12.9 g/dL (ref 12.0–15.0)
MCH: 29.5 pg (ref 26.0–34.0)
MCHC: 33.7 g/dL (ref 30.0–36.0)
MCV: 87.4 fL (ref 80.0–100.0)
Platelets: 290 10*3/uL (ref 150–400)
RBC: 4.38 MIL/uL (ref 3.87–5.11)
RDW: 12.1 % (ref 11.5–15.5)
WBC: 10.4 10*3/uL (ref 4.0–10.5)
nRBC: 0 % (ref 0.0–0.2)

## 2019-02-24 LAB — BASIC METABOLIC PANEL WITH GFR
Anion gap: 14 (ref 5–15)
BUN: 18 mg/dL (ref 6–20)
CO2: 23 mmol/L (ref 22–32)
Calcium: 8.8 mg/dL — ABNORMAL LOW (ref 8.9–10.3)
Chloride: 102 mmol/L (ref 98–111)
Creatinine, Ser: 1.09 mg/dL — ABNORMAL HIGH (ref 0.44–1.00)
GFR calc Af Amer: >60 mL/min
GFR calc non Af Amer: >60 mL/min
Glucose, Bld: 203 mg/dL — ABNORMAL HIGH (ref 70–99)
Potassium: 2.5 mmol/L — CL (ref 3.5–5.1)
Sodium: 139 mmol/L (ref 135–145)

## 2019-02-24 MED ORDER — POTASSIUM CHLORIDE 10 MEQ/100ML IV SOLN
10.0000 meq | Freq: Once | INTRAVENOUS | Status: AC
  Administered 2019-02-25: 10 meq via INTRAVENOUS
  Filled 2019-02-24: qty 100

## 2019-02-24 MED ORDER — POTASSIUM CHLORIDE CRYS ER 20 MEQ PO TBCR
40.0000 meq | EXTENDED_RELEASE_TABLET | Freq: Once | ORAL | Status: AC
  Administered 2019-02-25: 40 meq via ORAL
  Filled 2019-02-24: qty 2

## 2019-02-24 MED ORDER — SODIUM CHLORIDE 0.9 % IV BOLUS
1000.0000 mL | Freq: Once | INTRAVENOUS | Status: AC
  Administered 2019-02-25: 1000 mL via INTRAVENOUS

## 2019-02-24 NOTE — ED Notes (Signed)
Husband Corene Cornea [phone]

## 2019-02-24 NOTE — ED Notes (Addendum)
This RN introduced self to pt. Pt husband at bedside. Pt states she remembers eating a "edible" oreo and feeling funny. Pt also states she had been drinking. Pt states she had a syncopal episode but denies hitting head. Pt states she has been unable to stop shaking since. Pt in NAD. Pt is tachycardic on monitor and states she feels anxious.

## 2019-02-24 NOTE — ED Triage Notes (Addendum)
Patient brought in by ems from home. Patient states that she took a bit of a "stoner Oreo about an hour ago. Patient states that she had a syncopal episode and can't stop shaking since. Patient states that she has also been drinking alcohol tonight.

## 2019-02-25 LAB — URINALYSIS, COMPLETE (UACMP) WITH MICROSCOPIC
Bacteria, UA: NONE SEEN
Bilirubin Urine: NEGATIVE
Glucose, UA: 50 mg/dL — AB
Hgb urine dipstick: NEGATIVE
Ketones, ur: NEGATIVE mg/dL
Leukocytes,Ua: NEGATIVE
Nitrite: NEGATIVE
Protein, ur: 100 mg/dL — AB
Specific Gravity, Urine: 1.028 (ref 1.005–1.030)
pH: 5 (ref 5.0–8.0)

## 2019-02-25 LAB — URINE DRUG SCREEN, QUALITATIVE (ARMC ONLY)
Amphetamines, Ur Screen: NOT DETECTED
Barbiturates, Ur Screen: NOT DETECTED
Benzodiazepine, Ur Scrn: NOT DETECTED
Cannabinoid 50 Ng, Ur ~~LOC~~: POSITIVE — AB
Cocaine Metabolite,Ur ~~LOC~~: NOT DETECTED
MDMA (Ecstasy)Ur Screen: NOT DETECTED
Methadone Scn, Ur: NOT DETECTED
Opiate, Ur Screen: NOT DETECTED
Phencyclidine (PCP) Ur S: NOT DETECTED
Tricyclic, Ur Screen: NOT DETECTED

## 2019-02-25 LAB — HCG, QUANTITATIVE, PREGNANCY: hCG, Beta Chain, Quant, S: <1 m[IU]/mL (ref <5)

## 2019-02-25 LAB — MAGNESIUM: Magnesium: 2.1 mg/dL (ref 1.7–2.4)

## 2019-02-25 LAB — POCT PREGNANCY, URINE: Preg Test, Ur: NEGATIVE

## 2019-02-25 MED ORDER — SODIUM CHLORIDE 0.9 % IV BOLUS
1000.0000 mL | Freq: Once | INTRAVENOUS | Status: AC
  Administered 2019-02-25: 1000 mL via INTRAVENOUS

## 2019-02-25 NOTE — ED Notes (Signed)
Pt assisted to bathroom

## 2019-02-25 NOTE — ED Notes (Signed)
Reviewed discharge instructions, follow-up care, and prescriptions with patient. Patient verbalized understanding of all information reviewed. Patient stable, with no distress noted at this time.    

## 2019-02-25 NOTE — ED Notes (Signed)
Pt assisted to bedside bathroom.

## 2019-02-25 NOTE — Discharge Instructions (Signed)
As we discussed, your labs are consistent with new diagnosis of diabetes.  Make sure to follow-up with your primary care doctor for further evaluation.

## 2019-02-25 NOTE — ED Notes (Signed)
Pt assisted back to bed and placed on monitors. Pt denies any needs at this time. Family at bedside. Will continue to monitor.

## 2019-02-25 NOTE — ED Provider Notes (Signed)
[facility] Emergency Department Provider Note  ____________________________________________  Time seen: Approximately 12:48 AM  I have reviewed the triage vital signs and the nursing notes.   HISTORY  Chief Complaint Loss of Consciousness   HPI Gail Lowe is a 33 y.o. female with no significant past medical history who presents for evaluation of syncope.  Patient reports that she was in a party this evening.  She was drinking sparkling wine and had a edible cannabis Oreo cookie.  She started feeling nauseous and diaphoretic, she became clammy and dizzy.  She left the party with her husband and took her kids home.  When she got home she had a syncopal episode and 911 was called.  Thinks that she may have had another syncopal episode in route to the hospital.  She reports that she feels her heart racing her chest.  She denies headache, chest pain or shortness of breath, cough, fever, nausea or vomiting, abdominal pain.  No personal or family history of blood clots, recent travel immobilization, leg pain or swelling, hemoptysis, or exogenous hormones.  Patient reports that she was doing fine before eating this edible cookie.   Patient did not sustain any injuries from her syncope.  History reviewed. No pertinent past medical history.  Patient Active Problem List   Diagnosis Date Noted  . Normal labor 11/13/2015    History reviewed. No pertinent surgical history.  Prior to Admission medications   Medication Sig Start Date End Date Taking? Authorizing Provider  ibuprofen (ADVIL,MOTRIN) 600 MG tablet Take 1 tablet (600 mg total) by mouth every 6 (six) hours as needed for mild pain or moderate pain. 11/15/15   Vena AustriaStaebler, Andreas, MD  Prenatal Vit-Fe Fumarate-FA (PRENATAL MULTIVITAMIN) TABS tablet Take 1 tablet by mouth daily at 12 noon.    [provider]    Allergies Patient has no known allergies.  No family history on file.  Social  History Social History   Tobacco Use  . Smoking status: Never Smoker  . Smokeless tobacco: Never Used  Substance Use Topics  . Alcohol use: Yes  . Drug use: No    Review of Systems  Constitutional: Negative for fever. + syncope Eyes: Negative for visual changes. ENT: Negative for sore throat. Neck: No neck pain  Cardiovascular: Negative for chest pain. + palpitations Respiratory: Negative for shortness of breath. Gastrointestinal: Negative for abdominal pain, vomiting or diarrhea. Genitourinary: Negative for dysuria. Musculoskeletal: Negative for back pain. Skin: Negative for rash. Neurological: Negative for headaches, weakness or numbness. Psych: No SI or HI  ____________________________________________   PHYSICAL EXAM:  VITAL SIGNS: ED Triage Vitals  Enc Vitals Group     BP 02/24/19 2010 (!) 142/76     Pulse Rate 02/24/19 2010 (!) 115     Resp 02/24/19 2010 18     Temp 02/24/19 2010 97.9 F (36.6 C)     Temp Source 02/24/19 2010 Oral     SpO2 02/24/19 2010 100 %     Weight 02/24/19 2007 180 lb (81.6 kg)     Height 02/24/19 2007 5\' 7"  (1.702 m)     Head Circumference --      Peak Flow --      Pain Score 02/24/19 2007 0     Pain Loc --      Pain Edu? --      Excl. in GC? --     Constitutional: Alert and oriented. Well appearing and in no apparent distress. HEENT:  Head: Normocephalic and atraumatic.         Eyes: Conjunctivae are normal. Sclera is non-icteric.       Mouth/Throat: Mucous membranes are moist.       Neck: Supple with no signs of meningismus. Cardiovascular: Tachycardic with regular rhythm. No murmurs, gallops, or rubs. 2+ symmetrical distal pulses are present in all extremities. No JVD. Respiratory: Normal respiratory effort. Lungs are clear to auscultation bilaterally. No wheezes, crackles, or rhonchi.  Gastrointestinal: Soft, non tender, and non distended with positive bowel sounds. No rebound or guarding. Musculoskeletal: Nontender with  normal range of motion in all extremities. No edema, cyanosis, or erythema of extremities. Neurologic: Normal speech and language. Face is symmetric. Moving all extremities. No gross focal neurologic deficits are appreciated. Skin: Skin is warm, dry and intact. No rash noted. Psychiatric: Mood and affect are normal. Speech and behavior are normal.  ____________________________________________   LABS (all labs ordered are listed, but only abnormal results are displayed)  Labs Reviewed  BASIC METABOLIC PANEL - Abnormal; Notable for the following components:      Result Value   Potassium 2.5 (*)    Glucose, Bld 203 (*)    Creatinine, Ser 1.09 (*)    Calcium 8.8 (*)    All other components within normal limits  URINALYSIS, COMPLETE (UACMP) WITH MICROSCOPIC - Abnormal; Notable for the following components:   Color, Urine YELLOW (*)    APPearance CLOUDY (*)    Glucose, UA 50 (*)    Protein, ur 100 (*)    All other components within normal limits  URINE DRUG SCREEN, QUALITATIVE ([facility] ONLY) - Abnormal; Notable for the following components:   Cannabinoid 50 Ng, Ur [place] POSITIVE (*)    All other components within normal limits  CBC  MAGNESIUM  HCG, QUANTITATIVE, PREGNANCY  ETHANOL  POC URINE PREG, ED  POCT PREGNANCY, URINE   ____________________________________________  EKG  ED ECG REPORT I, Nita Sickle, the attending physician, personally viewed and interpreted this ECG.  Sinus tachycardia with PVCs, rate of 121, normal intervals, normal axis, no ST elevations or depressions.  Calculated QTC of 454.  No prior for comparison. ____________________________________________  RADIOLOGY  none  ____________________________________________   PROCEDURES  Procedure(s) performed: None Procedures Critical Care performed:  None ____________________________________________   INITIAL IMPRESSION / ASSESSMENT AND PLAN / ED COURSE  33 y.o. female with no significant past medical  history who presents for evaluation of syncope preceded by nausea, clammy, diaphoresis after eating a cannabis Oreo cookie and drinking wine this evening.  Patient is tachycardic with normal blood pressure, otherwise neurologically intact.  Her EKG shows no acute ischemic changes or dysrhythmias.  She is being monitored on telemetry.  Differential diagnosis includes side effect from her ingestion versus dehydration versus cardiac syncope versus orthostasis versus anemia. Patient has no CP or SOB  Labs showing hypokalemia.  EKG with normal intervals.  Calculated QTC of 454.  Magnesium is normal.  Will supplemented p.o. and IV.  Patient is tachycardic with an EKG showing sinus tachycardia.  Will give IV fluids.  Pregnancy negative.  No anemia.  We will reassess after IV fluids for disposition.    _________________________ 4:21 AM on 02/25/2019 -----------------------------------------  Patient received 2 L of IV fluid with improvement of her heart rate.  Still slightly tachycardic most likely due to cannabis use.  Discussed with the patient her mildly elevated glucose and glucosuria and recommended follow-up with PCP for further evaluation of new onset diabetes.  At this  time patient is stable for outpatient follow-up.  Discussed my standard return precautions.    As part of my medical decision making, I reviewed the following data within the [place] History obtained from family, Nursing notes reviewed and incorporated, Labs reviewed , EKG interpreted , Old chart reviewed, Notes from prior ED visits and [place] Controlled Substance Database   Please note:  Patient was evaluated in Emergency Department today for the symptoms described in the history of present illness. Patient was evaluated in the context of the global COVID-19 pandemic, which necessitated consideration that the patient might be at risk for infection with the SARS-CoV-2 virus that causes COVID-19. Institutional protocols  and algorithms that pertain to the evaluation of patients at risk for COVID-19 are in a state of rapid change based on information released by regulatory bodies including the CDC and federal and state organizations. These policies and algorithms were followed during the patient's care in the ED.  Some ED evaluations and interventions may be delayed as a result of limited staffing during the pandemic.   ____________________________________________   FINAL CLINICAL IMPRESSION(S) / ED DIAGNOSES   Final diagnoses:  Syncope, unspecified syncope type  Hypokalemia  Cannabis intoxication with complication ([place])  Hyperglycemia      NEW MEDICATIONS STARTED DURING THIS VISIT:  ED Discharge Orders    None       Note:  This document was prepared using Dragon voice recognition software and may include unintentional dictation errors.    Alfred Levins, [place], MD 02/25/19 [phone]

## 2021-09-12 ENCOUNTER — Emergency Department: Payer: 59

## 2021-09-12 ENCOUNTER — Emergency Department
Admission: EM | Admit: 2021-09-12 | Discharge: 2021-09-12 | Disposition: A | Discharge status: Home / Self Care | Payer: 59 | Attending: Emergency Medicine | Admitting: Emergency Medicine

## 2021-09-12 ENCOUNTER — Other Ambulatory Visit: Payer: Self-pay

## 2021-09-12 DIAGNOSIS — R202 Paresthesia of skin: Secondary | ICD-10-CM

## 2021-09-12 DIAGNOSIS — M5412 Radiculopathy, cervical region: Secondary | ICD-10-CM

## 2021-09-12 DIAGNOSIS — R519 Headache, unspecified: Secondary | ICD-10-CM

## 2021-09-12 LAB — BASIC METABOLIC PANEL
Anion gap: 7 (ref 5–15)
BUN: 9 mg/dL (ref 6–20)
CO2: 28 mmol/L (ref 22–32)
Calcium: 9.5 mg/dL (ref 8.9–10.3)
Chloride: 105 mmol/L (ref 98–111)
Creatinine, Ser: 0.51 mg/dL (ref 0.44–1.00)
GFR, Estimated: >60 mL/min (ref >60)
Glucose, Bld: 107 mg/dL — ABNORMAL HIGH (ref 70–99)
Potassium: 3.9 mmol/L (ref 3.5–5.1)
Sodium: 140 mmol/L (ref 135–145)

## 2021-09-12 LAB — CBC
HCT: 39.1 % (ref 36.0–46.0)
Hemoglobin: 13 g/dL (ref 12.0–15.0)
MCH: 29.1 pg (ref 26.0–34.0)
MCHC: 33.2 g/dL (ref 30.0–36.0)
MCV: 87.5 fL (ref 80.0–100.0)
Platelets: 271 10*3/uL (ref 150–400)
RBC: 4.47 MIL/uL (ref 3.87–5.11)
RDW: 12.2 % (ref 11.5–15.5)
WBC: 4.1 10*3/uL (ref 4.0–10.5)
nRBC: 0 % (ref 0.0–0.2)

## 2021-09-12 LAB — POC URINE PREG, ED: Preg Test, Ur: NEGATIVE

## 2021-09-12 LAB — TROPONIN I (HIGH SENSITIVITY): Troponin I (High Sensitivity): <2 ng/L (ref <18)

## 2021-09-12 NOTE — ED Provider Notes (Signed)
[facility] Provider Note   Event Date/Time   First MD Initiated Contact with Patient 09/12/21 1125     (approximate) History  Headache and arm tingling  HPI Gail Lowe is a 36 y.o. female  Location: Head and left upper extremity Duration: 2 days prior to arrival Timing: Improved since onset Severity: Mild Quality: Paresthesias, aching Context: Patient states that during work 2 days prior to arrival she began having a headache behind her eyes as well as tingling sensation to the left upper extremity that she has not experienced in the past Modifying factors: Denies any exacerbating or relieving factors however does state that after she stopped working on her computer symptoms generally improved Associated Symptoms: Denies ROS: Patient currently denies any vision changes, tinnitus, difficulty speaking, facial droop, sore throat, chest pain, shortness of breath, abdominal pain, nausea/vomiting/diarrhea, dysuria   Physical Exam  Triage Vital Signs: ED Triage Vitals  Enc Vitals Group     BP 09/12/21 1114 (!) 161/100     Pulse Rate 09/12/21 1114 94     Resp 09/12/21 1114 16     Temp 09/12/21 1114 98.7 F (37.1 C)     Temp Source 09/12/21 1114 Oral     SpO2 09/12/21 1114 100 %     Weight 09/12/21 1111 182 lb (82.6 kg)     Height 09/12/21 1111 5\' 7"  (1.702 m)     Head Circumference --      Peak Flow --      Pain Score 09/12/21 1111 0     Pain Loc --      Pain Edu? --      Excl. in GC? --    Most recent vital signs: Vitals:   09/12/21 1230 09/12/21 1330  BP: (!) 133/93 (!) 152/99  Pulse: 72 (!) 117  Resp: 14 14  Temp:    SpO2: 100% 100%   General: Awake, oriented x4. CV:  Good peripheral perfusion.  Resp:  Normal effort.  Abd:  No distention.  Other:  Middle-aged African-American female laying in bed in no acute distress.  Negative Spurling test ED Results / Procedures / Treatments  Labs (all labs ordered are listed, but only  abnormal results are displayed) Labs Reviewed  BASIC METABOLIC PANEL - Abnormal; Notable for the following components:      Result Value   Glucose, Bld 107 (*)    All other components within normal limits  CBC  POC URINE PREG, ED  TROPONIN I (HIGH SENSITIVITY)  TROPONIN I (HIGH SENSITIVITY)   EKG ED ECG REPORT I, 09/14/21, the attending physician, personally viewed and interpreted this ECG. Date: 09/12/2021 EKG Time: 1131 Rate: 77 Rhythm: normal sinus rhythm QRS Axis: normal Intervals: normal ST/T Wave abnormalities: normal Narrative Interpretation: no evidence of acute ischemia RADIOLOGY ED MD interpretation: CT of the head without contrast interpreted by me shows no evidence of acute abnormalities including no intracerebral hemorrhage, obvious masses, or significant edema  CT of the cervical spine interpreted by me does not show any evidence of acute abnormalities including no acute fracture, malalignment, height loss, or dislocation -Agree with radiology assessment Official radiology report(s): CT Head Wo Contrast  Result Date: 09/12/2021 CLINICAL DATA:  Headache and blurred vision beginning yesterday. Left arm paresthesias. EXAM: CT HEAD WITHOUT CONTRAST CT CERVICAL SPINE WITHOUT CONTRAST TECHNIQUE: Multidetector CT imaging of the head and cervical spine was performed following the standard protocol without intravenous contrast. Multiplanar CT image reconstructions of the cervical  spine were also generated. RADIATION DOSE REDUCTION: This exam was performed according to the departmental dose-optimization program which includes automated exposure control, adjustment of the mA and/or kV according to patient size and/or use of iterative reconstruction technique. COMPARISON:  Head only CT on 08/23/2009 FINDINGS: CT HEAD FINDINGS Brain: No evidence of intracranial hemorrhage, acute infarction, hydrocephalus, extra-axial collection, or mass lesion/mass effect. Vascular:  No  hyperdense vessel or other acute findings. Skull: No evidence of fracture or other significant bone abnormality. Sinuses/Orbits:  No acute findings. Other: None. CT CERVICAL SPINE FINDINGS Alignment: Normal. Skull base and vertebrae: No acute fracture. No primary bone lesion or focal pathologic process. Soft tissues and spinal canal: No prevertebral fluid or swelling. No visible canal hematoma. Disc levels: No disc space narrowing. Upper chest: No acute findings. Other: Cervical kyphosis noted, which may be due to patient positioning or muscle spasm. IMPRESSION: Negative noncontrast head CT. No evidence of cervical spine fracture or subluxation. Cervical kyphosis, which may be due to patient positioning or muscle spasm; clinical correlation is recommended. Electronically Signed   By: Danae Orleans M.D.   On: 09/12/2021 13:19   CT Cervical Spine Wo Contrast  Result Date: 09/12/2021 CLINICAL DATA:  Headache and blurred vision beginning yesterday. Left arm paresthesias. EXAM: CT HEAD WITHOUT CONTRAST CT CERVICAL SPINE WITHOUT CONTRAST TECHNIQUE: Multidetector CT imaging of the head and cervical spine was performed following the standard protocol without intravenous contrast. Multiplanar CT image reconstructions of the cervical spine were also generated. RADIATION DOSE REDUCTION: This exam was performed according to the departmental dose-optimization program which includes automated exposure control, adjustment of the mA and/or kV according to patient size and/or use of iterative reconstruction technique. COMPARISON:  Head only CT on 08/23/2009 FINDINGS: CT HEAD FINDINGS Brain: No evidence of intracranial hemorrhage, acute infarction, hydrocephalus, extra-axial collection, or mass lesion/mass effect. Vascular:  No hyperdense vessel or other acute findings. Skull: No evidence of fracture or other significant bone abnormality. Sinuses/Orbits:  No acute findings. Other: None. CT CERVICAL SPINE FINDINGS Alignment:  Normal. Skull base and vertebrae: No acute fracture. No primary bone lesion or focal pathologic process. Soft tissues and spinal canal: No prevertebral fluid or swelling. No visible canal hematoma. Disc levels: No disc space narrowing. Upper chest: No acute findings. Other: Cervical kyphosis noted, which may be due to patient positioning or muscle spasm. IMPRESSION: Negative noncontrast head CT. No evidence of cervical spine fracture or subluxation. Cervical kyphosis, which may be due to patient positioning or muscle spasm; clinical correlation is recommended. Electronically Signed   By: Danae Orleans M.D.   On: 09/12/2021 13:19   PROCEDURES: Critical Care performed: No .1-3 Lead EKG Interpretation  Performed by: Merwyn Katos, MD Authorized by: Merwyn Katos, MD     Interpretation: normal     ECG rate:  98   ECG rate assessment: normal     Rhythm: sinus rhythm     Ectopy: none     Conduction: normal    MEDICATIONS ORDERED IN ED: Medications - No data to display IMPRESSION / MDM / ASSESSMENT AND PLAN / ED COURSE  I reviewed the triage vital signs and the nursing notes.                             The patient is on the cardiac monitor to evaluate for evidence of arrhythmia and/or significant heart rate changes. Patient's presentation is most consistent with acute presentation with potential  threat to life or bodily function. Presents with sensation of tingling to left upper extremity.  Patients symptoms and work-up not consistent with a stroke and therefore they will be discharged from the ED.  Patient has currently been stabilized in the emergency department.  Patient received an head CT that was negative for stroke.  Patients symptoms not typical for other emergent causes such as dissection, infection, DKA, trauma. Patient will be discharged with strict return precautions and follow up with primary MD within 24 hours for further evaluation.   FINAL CLINICAL IMPRESSION(S) / ED  DIAGNOSES   Final diagnoses:  Acute nonintractable headache, unspecified headache type  Paresthesia of left upper extremity  Cervical radiculopathy   Rx / DC Orders   ED Discharge Orders     None      Note:  This document was prepared using Dragon voice recognition software and may include unintentional dictation errors.   Merwyn Katos, MD 09/12/21 [phone]

## 2021-09-12 NOTE — Discharge Instructions (Addendum)
Please use ibuprofen (Motrin) up to 800 mg every 8 hours, naproxen (Naprosyn) up to 500 mg every 12 hours, and/or acetaminophen (Tylenol) up to 4 g/day for any continued pain 

## 2021-09-12 NOTE — ED Triage Notes (Signed)
Pt to ED POV for L arm tingling since 2-3 days, states had HA yesterday 5/10, also has been "yawning a lot". States yesterday vision was slightly blurry yesterday while at work looking at computer. No HA now, L arm is still tingling from elbow to hand. No focal motor or sensory deficits noted at this time.

## 2023-06-18 IMAGING — CT CT HEAD W/O CM
3 series · 17 of 37 positions shown, 19 images · non-contrast
Comparison: Head only CT on 08/23/2009

CLINICAL DATA: Headache and blurred vision beginning yesterday.
Left arm paresthesias.



[Series 2: head wo · axial · 0.41mm/px · z∈[-148,-28]mm · 7 of 34 slices shown, 9 images]
[im 5/34  brain]
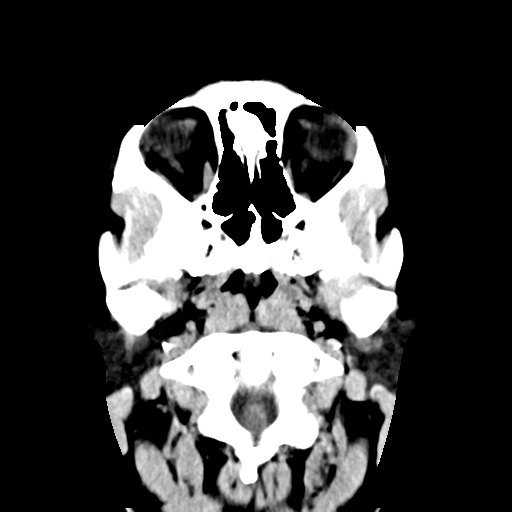
[im 5/34  bone]
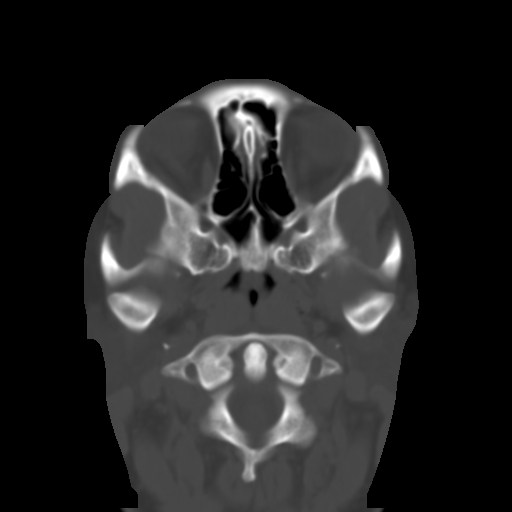
[im 9/34  brain]
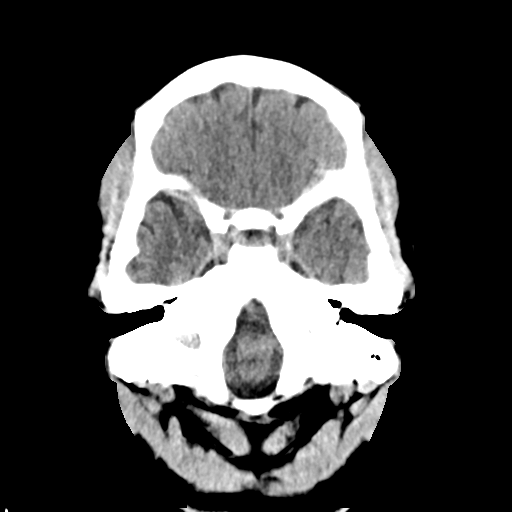
[im 13/34  brain]
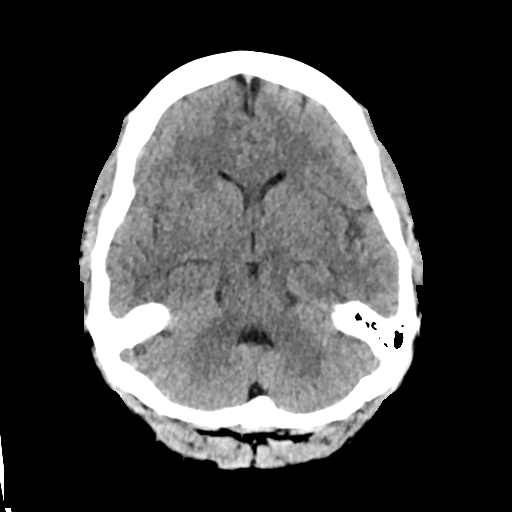
[im 17/34  brain]
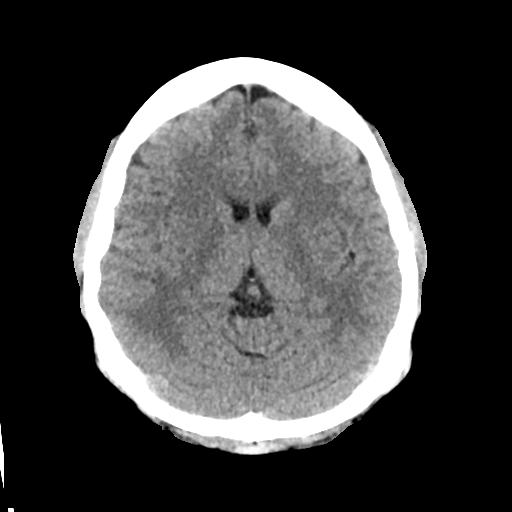
[im 21/34  brain]
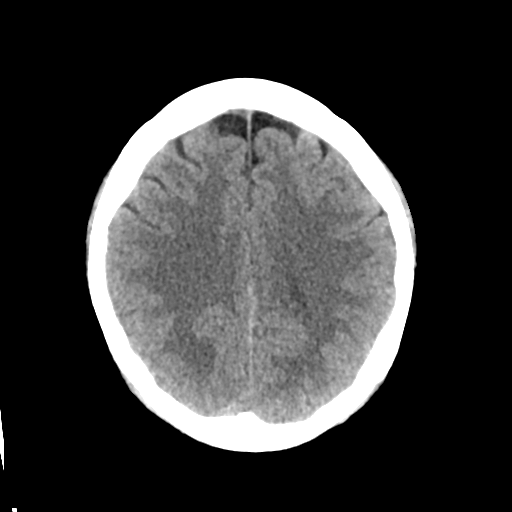
[im 21/34  bone]
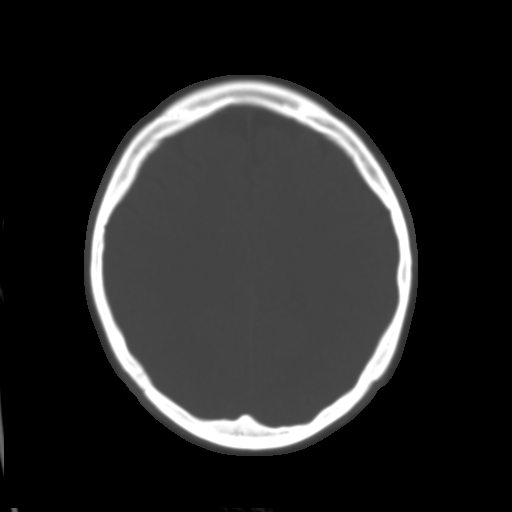
[im 25/34  brain]
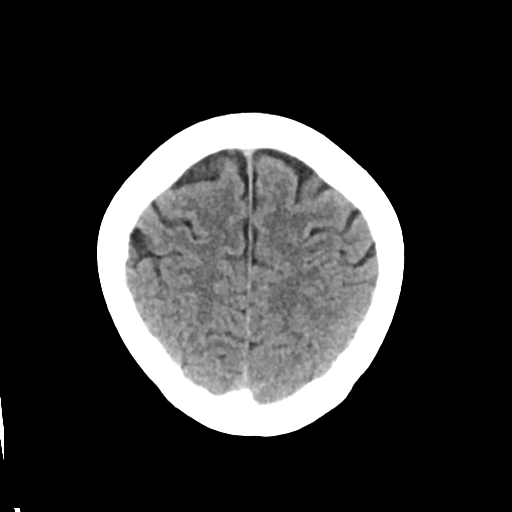
[im 29/34  brain]
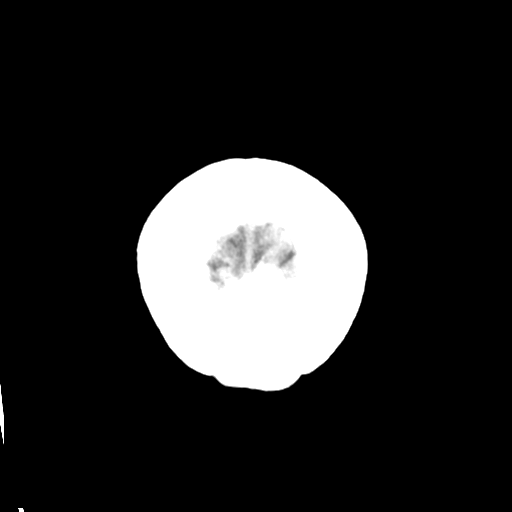

[Series 3: head bone · axial · 0.41mm/px · z∈[-152,-34]mm · 7 of 85 slices shown]
[im 9/85  bone]
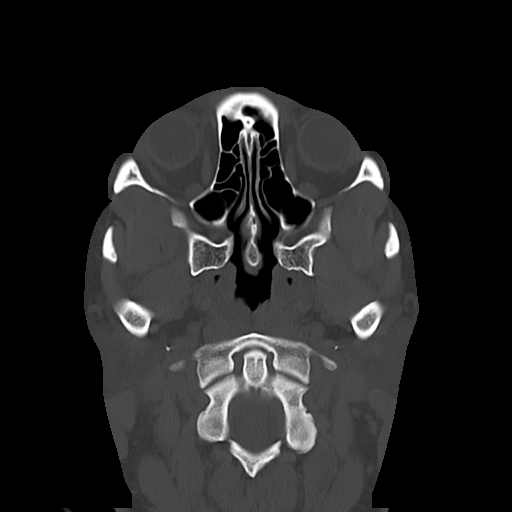
[im 17/85  bone]
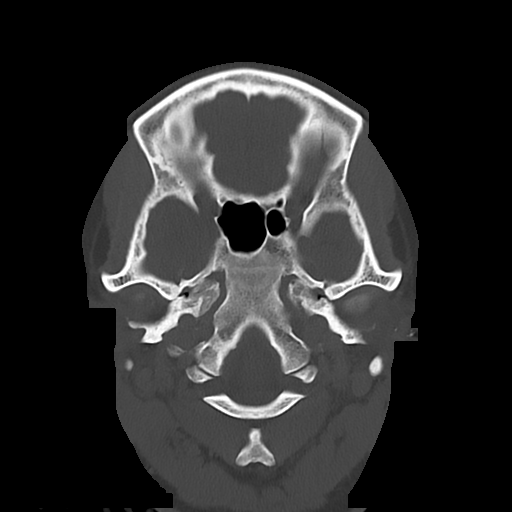
[im 26/85  bone]
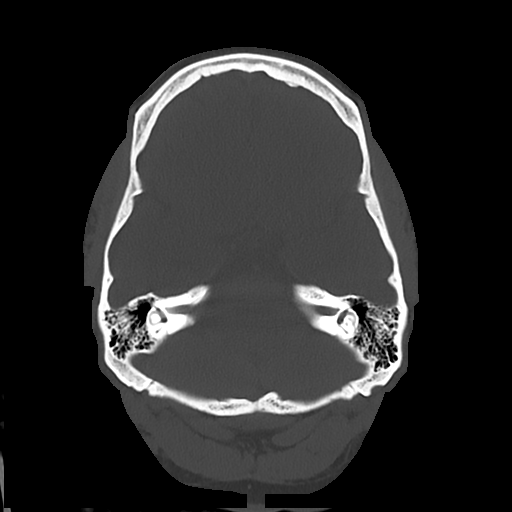
[im 38/85  bone]
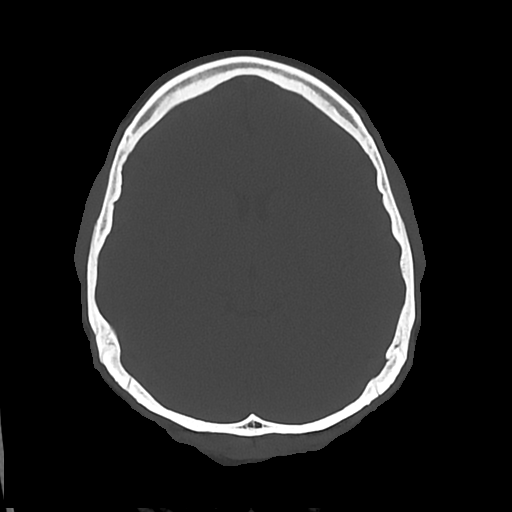
[im 47/85  bone]
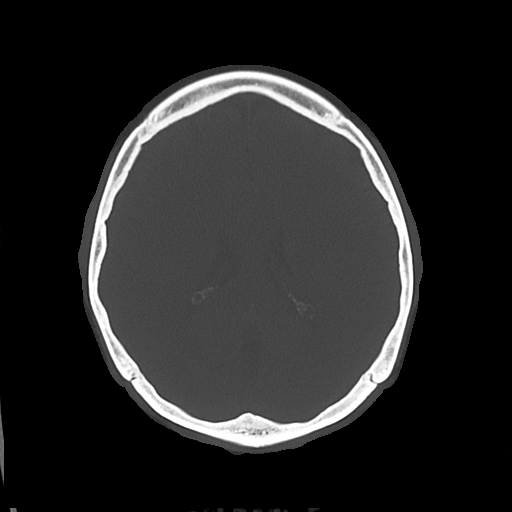
[im 59/85  bone]
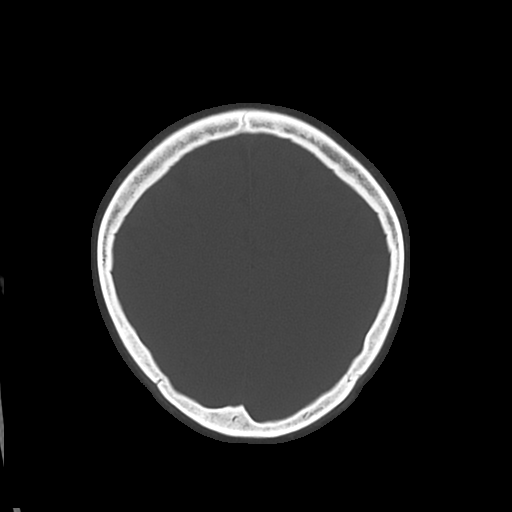
[im 68/85  bone]
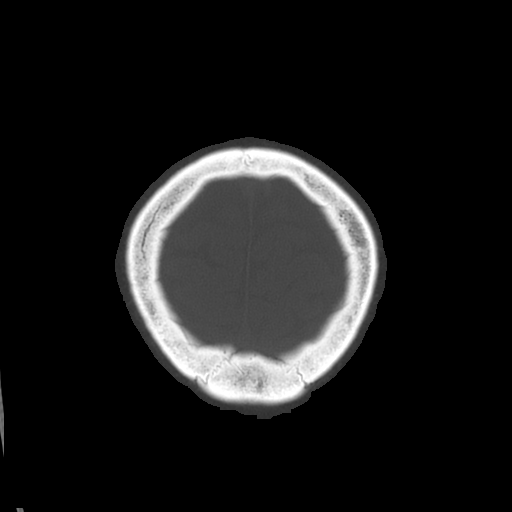

[Series 5: sag soft · sagittal · 0.34mm/px · 3 of 58 slices shown]
[im 20/58  brain]
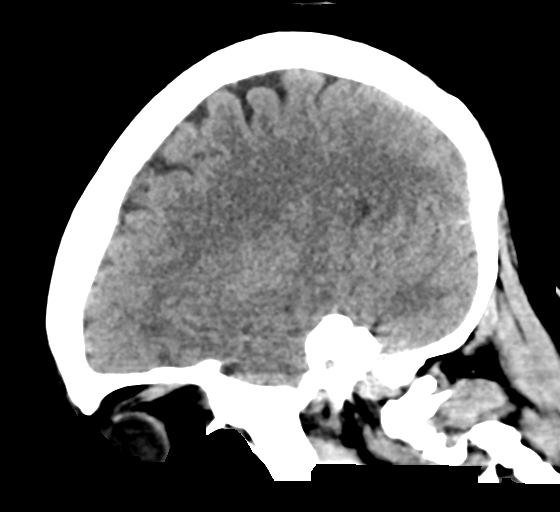
[im 29/58  brain]
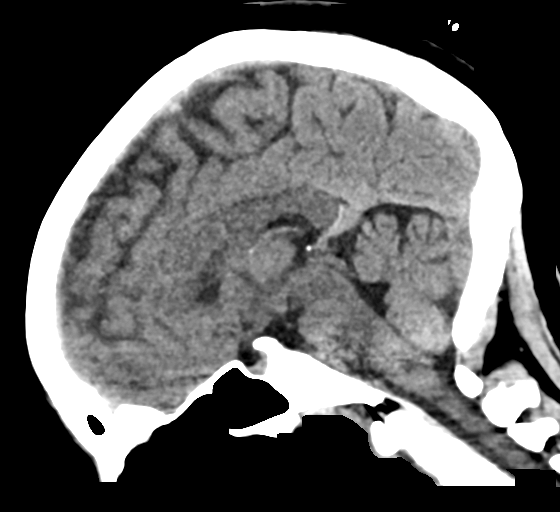
[im 39/58  brain]
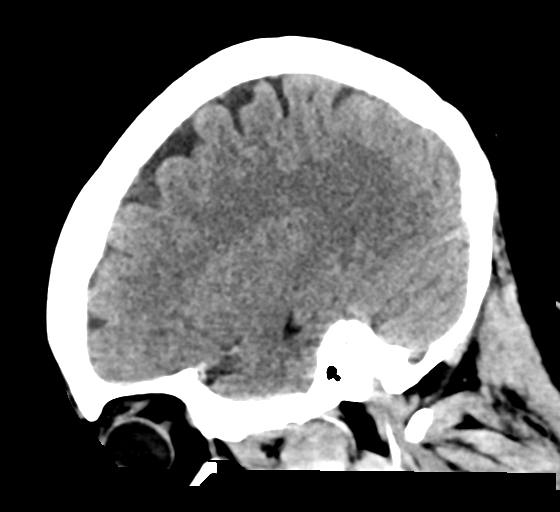

[17 of 37 positions shown; findings below may reference images not displayed]

FINDINGS: CT HEAD FINDINGS

Brain: No evidence of intracranial hemorrhage, acute infarction,
hydrocephalus, extra-axial collection, or mass lesion/mass effect.

Vascular:  No hyperdense vessel or other acute findings.

Skull: No evidence of fracture or other significant bone
abnormality.

Sinuses/Orbits:  No acute findings.

Other: None.

CT CERVICAL SPINE FINDINGS

Alignment: Normal.

Skull base and vertebrae: No acute fracture. No primary bone lesion
or focal pathologic process.

Soft tissues and spinal canal: No prevertebral fluid or swelling. No
visible canal hematoma.

Disc levels: No disc space narrowing.

Upper chest: No acute findings.

Other: Cervical kyphosis noted, which may be due to patient
positioning or muscle spasm.
IMPRESSION: Negative noncontrast head CT.

No evidence of cervical spine fracture or subluxation. Cervical
kyphosis, which may be due to patient positioning or muscle spasm;
clinical correlation is recommended.

## 2023-06-18 IMAGING — CT CT CERVICAL SPINE W/O CM
3 of 4 series · 12 of 33 positions shown, 14 images · non-contrast
Comparison: Head only CT on 08/23/2009

CLINICAL DATA: Headache and blurred vision beginning yesterday.
Left arm paresthesias.



[Series 5: sag bone · sagittal · 0.28mm/px · 5 of 91 slices shown, 6 images]
[im 31/91  bone]
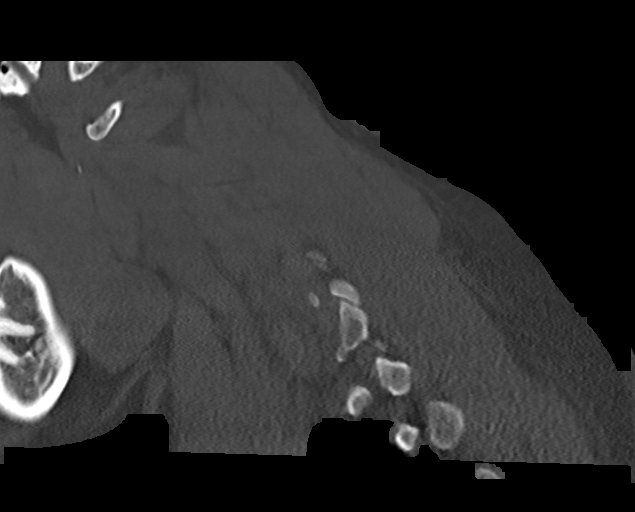
[im 38/91  bone]
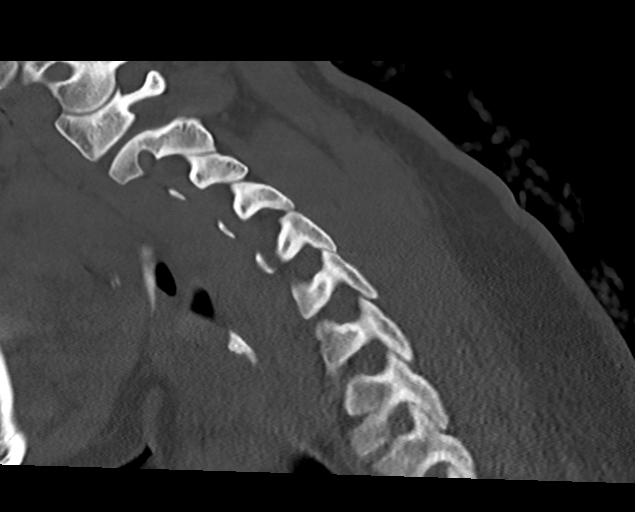
[im 46/91  soft-tissue]
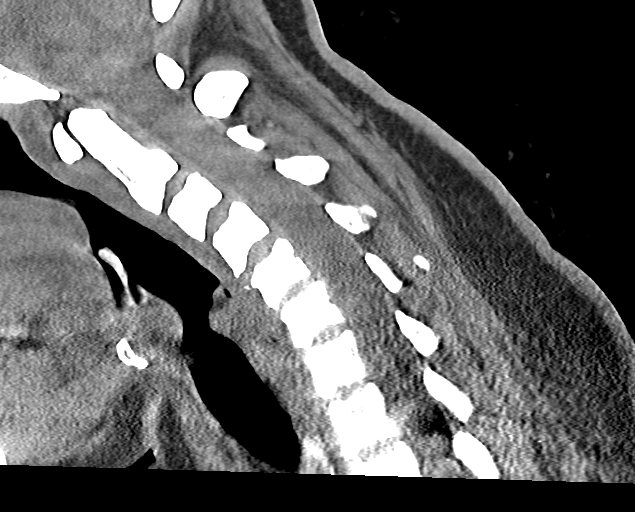
[im 46/91  bone]
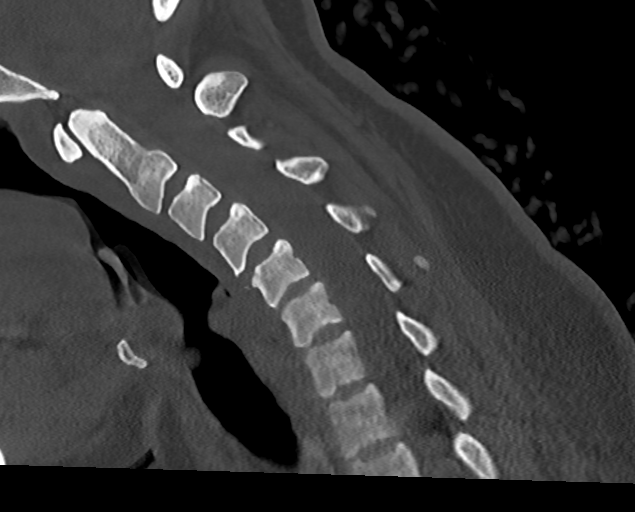
[im 53/91  bone]
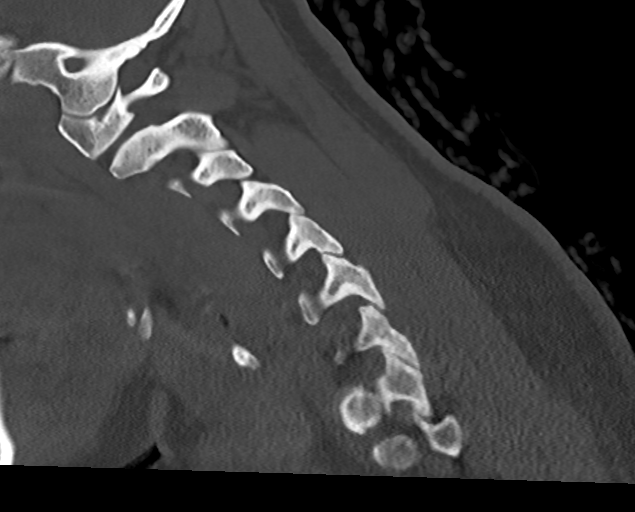
[im 61/91  bone]
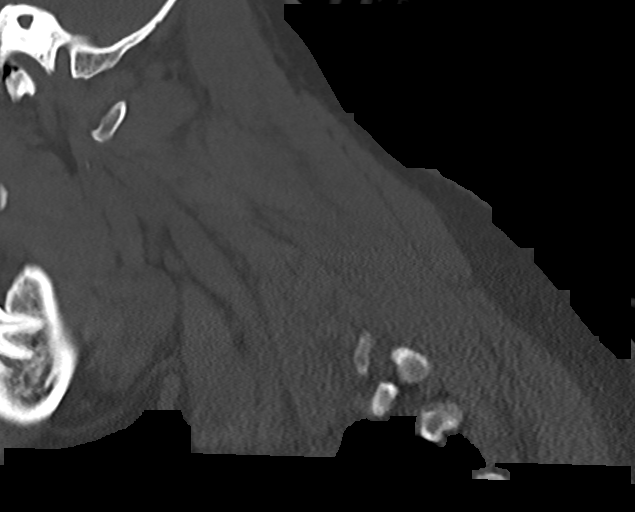

[Series 6: cor bone · coronal · 0.33mm/px · 3 of 88 slices shown]
[im 32/88  bone]
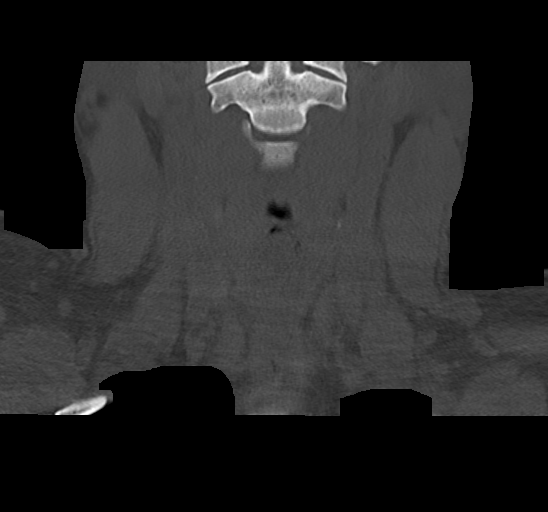
[im 40/88  bone]
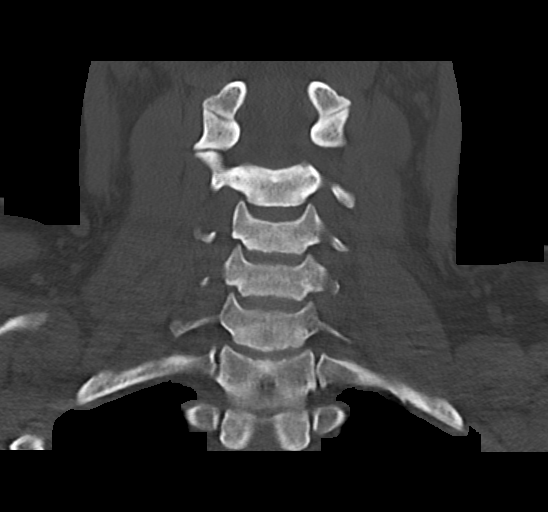
[im 48/88  bone]
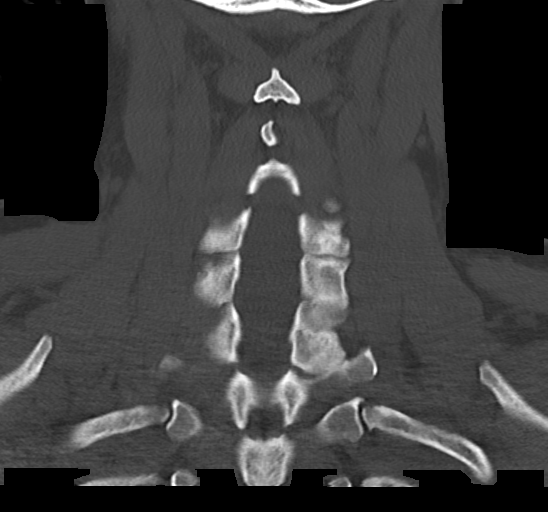

[Series 7: orthogonal axials · axial · 0.25mm/px · z∈[-281,-177]mm · 4 of 90 slices shown, 5 images]
[im 13/90  soft-tissue]
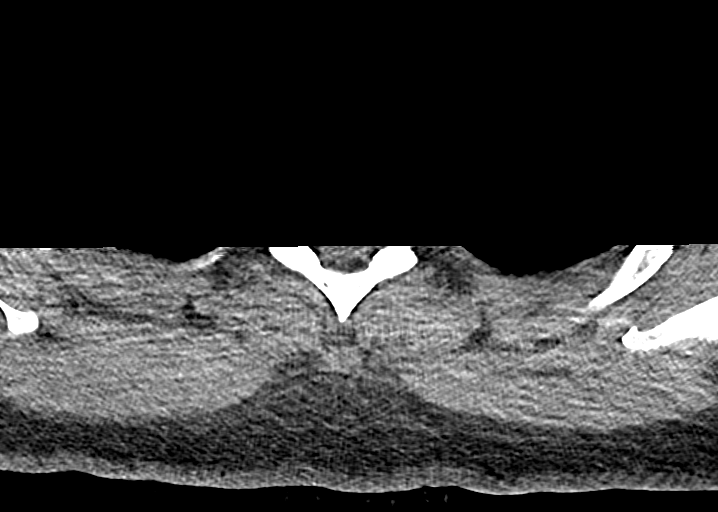
[im 13/90  bone]
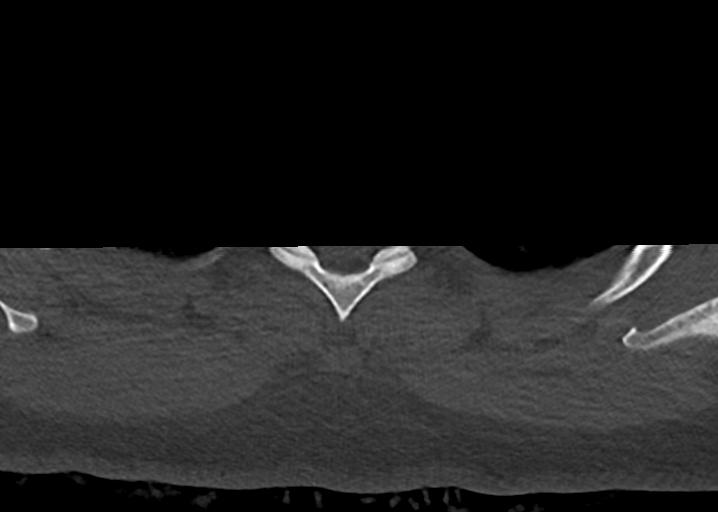
[im 39/90  bone]
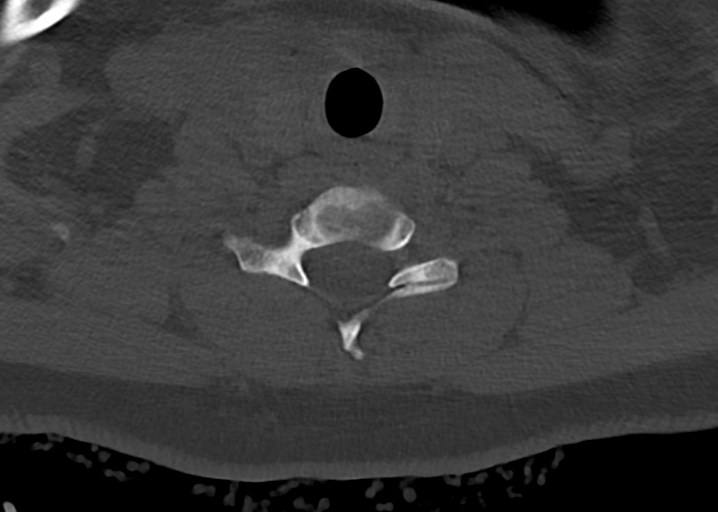
[im 51/90  bone]
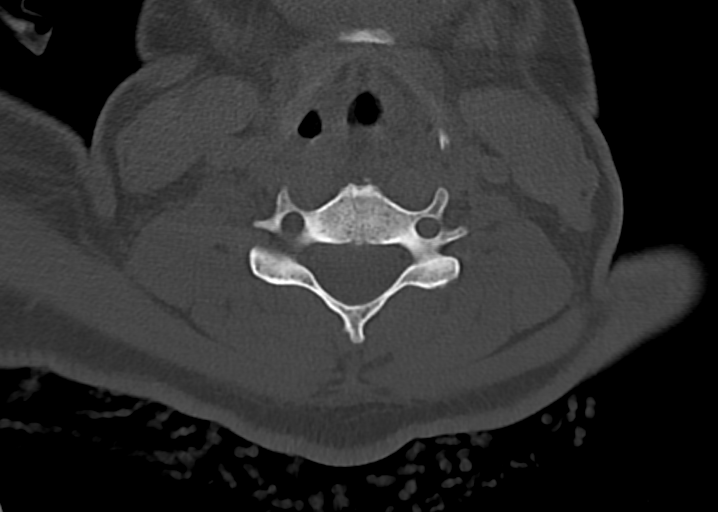
[im 77/90  bone]
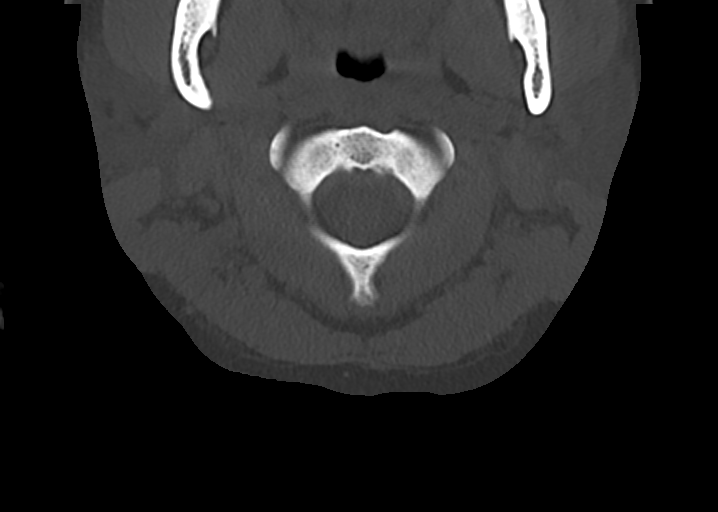

[12 of 33 positions shown; findings below may reference images not displayed]

FINDINGS: CT HEAD FINDINGS

Brain: No evidence of intracranial hemorrhage, acute infarction,
hydrocephalus, extra-axial collection, or mass lesion/mass effect.

Vascular:  No hyperdense vessel or other acute findings.

Skull: No evidence of fracture or other significant bone
abnormality.

Sinuses/Orbits:  No acute findings.

Other: None.

CT CERVICAL SPINE FINDINGS

Alignment: Normal.

Skull base and vertebrae: No acute fracture. No primary bone lesion
or focal pathologic process.

Soft tissues and spinal canal: No prevertebral fluid or swelling. No
visible canal hematoma.

Disc levels: No disc space narrowing.

Upper chest: No acute findings.

Other: Cervical kyphosis noted, which may be due to patient
positioning or muscle spasm.
IMPRESSION: Negative noncontrast head CT.

No evidence of cervical spine fracture or subluxation. Cervical
kyphosis, which may be due to patient positioning or muscle spasm;
clinical correlation is recommended.

## 2023-10-20 ENCOUNTER — Other Ambulatory Visit: Payer: Self-pay | Admitting: Obstetrics

## 2023-10-20 DIAGNOSIS — Z1231 Encounter for screening mammogram for malignant neoplasm of breast: Secondary | ICD-10-CM

## 2023-12-29 ENCOUNTER — Ambulatory Visit
Admission: RE | Admit: 2023-12-29 | Discharge: 2023-12-29 | Disposition: A | Discharge status: Home / Self Care | Source: Ambulatory Visit | Attending: Obstetrics | Admitting: Obstetrics

## 2023-12-29 DIAGNOSIS — Z1231 Encounter for screening mammogram for malignant neoplasm of breast: Secondary | ICD-10-CM | POA: Insufficient documentation

## 2024-01-03 ENCOUNTER — Other Ambulatory Visit: Payer: Self-pay | Admitting: Obstetrics

## 2024-01-03 DIAGNOSIS — R928 Other abnormal and inconclusive findings on diagnostic imaging of breast: Secondary | ICD-10-CM

## 2024-01-06 ENCOUNTER — Ambulatory Visit
Admission: RE | Admit: 2024-01-06 | Discharge: 2024-01-06 | Disposition: A | Discharge status: Home / Self Care | Source: Ambulatory Visit | Attending: Obstetrics | Admitting: Obstetrics

## 2024-01-06 DIAGNOSIS — R928 Other abnormal and inconclusive findings on diagnostic imaging of breast: Secondary | ICD-10-CM | POA: Insufficient documentation
